# Patient Record
Sex: Male | Born: 1935 | Race: White | Hispanic: No | Marital: Single | Smoking: Current every day smoker
Health system: Southern US, Community
[De-identification: ages and names within clinical notes are randomized; demographics above are authoritative.]

## PROBLEM LIST (undated history)

## (undated) DIAGNOSIS — J449 Chronic obstructive pulmonary disease, unspecified: Secondary | ICD-10-CM

## (undated) DIAGNOSIS — I10 Essential (primary) hypertension: Secondary | ICD-10-CM

## (undated) DIAGNOSIS — N189 Chronic kidney disease, unspecified: Secondary | ICD-10-CM

## (undated) DIAGNOSIS — K219 Gastro-esophageal reflux disease without esophagitis: Secondary | ICD-10-CM

## (undated) DIAGNOSIS — G709 Myoneural disorder, unspecified: Secondary | ICD-10-CM

## (undated) HISTORY — PX: NECK SURGERY: SHX720

---

## 2006-01-15 ENCOUNTER — Ambulatory Visit: Payer: Self-pay | Admitting: Internal Medicine

## 2008-09-07 ENCOUNTER — Ambulatory Visit: Payer: Self-pay | Admitting: Internal Medicine

## 2008-09-08 ENCOUNTER — Ambulatory Visit: Payer: Self-pay | Admitting: Internal Medicine

## 2009-04-27 ENCOUNTER — Ambulatory Visit: Payer: Self-pay | Admitting: Gastroenterology

## 2013-09-24 ENCOUNTER — Ambulatory Visit: Payer: Self-pay | Admitting: Orthopedic Surgery

## 2016-03-15 DIAGNOSIS — R972 Elevated prostate specific antigen [PSA]: Secondary | ICD-10-CM | POA: Insufficient documentation

## 2016-03-15 DIAGNOSIS — J432 Centrilobular emphysema: Secondary | ICD-10-CM | POA: Insufficient documentation

## 2016-03-16 ENCOUNTER — Other Ambulatory Visit: Payer: Self-pay | Admitting: Internal Medicine

## 2016-03-16 DIAGNOSIS — J984 Other disorders of lung: Secondary | ICD-10-CM

## 2016-03-30 ENCOUNTER — Ambulatory Visit
Admission: RE | Admit: 2016-03-30 | Discharge: 2016-03-30 | Disposition: A | Discharge status: Home / Self Care | Payer: Medicare Other | Source: Ambulatory Visit | Attending: Internal Medicine | Admitting: Internal Medicine

## 2016-03-30 DIAGNOSIS — J984 Other disorders of lung: Secondary | ICD-10-CM

## 2016-03-30 DIAGNOSIS — J439 Emphysema, unspecified: Secondary | ICD-10-CM | POA: Diagnosis not present

## 2016-03-30 DIAGNOSIS — E041 Nontoxic single thyroid nodule: Secondary | ICD-10-CM | POA: Diagnosis not present

## 2016-03-30 DIAGNOSIS — R918 Other nonspecific abnormal finding of lung field: Secondary | ICD-10-CM | POA: Diagnosis not present

## 2016-03-30 LAB — POCT I-STAT CREATININE: Creatinine, Ser: 1.7 mg/dL — ABNORMAL HIGH (ref 0.61–1.24)

## 2016-03-30 MED ORDER — IOPAMIDOL (ISOVUE-300) INJECTION 61%
75.0000 mL | Freq: Once | INTRAVENOUS | Status: AC | PRN
  Administered 2016-03-30: 75 mL via INTRAVENOUS

## 2016-04-07 ENCOUNTER — Telehealth: Payer: Self-pay | Admitting: *Deleted

## 2016-04-07 ENCOUNTER — Inpatient Hospital Stay: Payer: Medicare Other

## 2016-04-07 ENCOUNTER — Encounter: Payer: Self-pay | Admitting: Oncology

## 2016-04-07 ENCOUNTER — Inpatient Hospital Stay: Payer: Medicare Other | Attending: Oncology | Admitting: Oncology

## 2016-04-07 VITALS — BP 184/94 | HR 89 | Temp 97.1°F | Resp 20 | Ht 70.0 in | Wt 163.0 lb

## 2016-04-07 DIAGNOSIS — I10 Essential (primary) hypertension: Secondary | ICD-10-CM | POA: Insufficient documentation

## 2016-04-07 DIAGNOSIS — R918 Other nonspecific abnormal finding of lung field: Secondary | ICD-10-CM | POA: Insufficient documentation

## 2016-04-07 DIAGNOSIS — N183 Chronic kidney disease, stage 3 unspecified: Secondary | ICD-10-CM | POA: Insufficient documentation

## 2016-04-07 DIAGNOSIS — Z808 Family history of malignant neoplasm of other organs or systems: Secondary | ICD-10-CM | POA: Insufficient documentation

## 2016-04-07 DIAGNOSIS — R970 Elevated carcinoembryonic antigen [CEA]: Secondary | ICD-10-CM | POA: Diagnosis not present

## 2016-04-07 DIAGNOSIS — Z87891 Personal history of nicotine dependence: Secondary | ICD-10-CM | POA: Diagnosis not present

## 2016-04-07 LAB — CBC WITH DIFFERENTIAL/PLATELET
BASOS ABS: 0.1 10*3/uL (ref 0–0.1)
BASOS PCT: 1 %
Eosinophils Absolute: 0.3 10*3/uL (ref 0–0.7)
Eosinophils Relative: 4 %
HEMATOCRIT: 42.9 % (ref 40.0–52.0)
HEMOGLOBIN: 14.3 g/dL (ref 13.0–18.0)
LYMPHS PCT: 18 %
Lymphs Abs: 1.4 10*3/uL (ref 1.0–3.6)
MCH: 31 pg (ref 26.0–34.0)
MCHC: 33.4 g/dL (ref 32.0–36.0)
MCV: 92.9 fL (ref 80.0–100.0)
MONO ABS: 0.7 10*3/uL (ref 0.2–1.0)
MONOS PCT: 8 %
NEUTROS PCT: 69 %
Neutro Abs: 5.6 10*3/uL (ref 1.4–6.5)
Platelets: 195 10*3/uL (ref 150–440)
RBC: 4.61 MIL/uL (ref 4.40–5.90)
RDW: 15.4 % — AB (ref 11.5–14.5)
WBC: 8 10*3/uL (ref 3.8–10.6)

## 2016-04-07 LAB — COMPREHENSIVE METABOLIC PANEL
ALT: 9 U/L — AB (ref 17–63)
AST: 17 U/L (ref 15–41)
Albumin: 4.2 g/dL (ref 3.5–5.0)
Alkaline Phosphatase: 54 U/L (ref 38–126)
Anion gap: 10 (ref 5–15)
BUN: 20 mg/dL (ref 6–20)
CHLORIDE: 103 mmol/L (ref 101–111)
CO2: 28 mmol/L (ref 22–32)
CREATININE: 1.94 mg/dL — AB (ref 0.61–1.24)
Calcium: 8.7 mg/dL — ABNORMAL LOW (ref 8.9–10.3)
GFR calc Af Amer: 36 mL/min — ABNORMAL LOW (ref >60)
GFR calc non Af Amer: 31 mL/min — ABNORMAL LOW (ref >60)
Glucose, Bld: 80 mg/dL (ref 65–99)
POTASSIUM: 4.3 mmol/L (ref 3.5–5.1)
SODIUM: 141 mmol/L (ref 135–145)
Total Bilirubin: 0.6 mg/dL (ref 0.3–1.2)
Total Protein: 7.6 g/dL (ref 6.5–8.1)

## 2016-04-07 LAB — APTT: APTT: 30 s (ref 24–36)

## 2016-04-07 LAB — PROTIME-INR
INR: 0.9
PROTHROMBIN TIME: 12.4 s (ref 11.4–15.0)

## 2016-04-07 LAB — PSA: PSA: 6.5 ng/mL — AB (ref 0.00–4.00)

## 2016-04-07 NOTE — Telephone Encounter (Signed)
Spoke with Misty at Dr. Clovis FredricksonKasa's office, patient scheduled for consultation visit with Dr. Belia HemanKasa on Thursday 6/15 @ 10:30. Patient notified of appointment and verbalized understanding.

## 2016-04-07 NOTE — Progress Notes (Signed)
Patient here today for new evaluation regarding lung mass. Patient denies concerns today.

## 2016-04-08 LAB — CANCER ANTIGEN 19-9: CA 19 9: 28 U/mL (ref 0–35)

## 2016-04-08 LAB — CEA: CEA: 15.6 ng/mL — AB (ref 0.0–4.7)

## 2016-04-10 NOTE — Progress Notes (Signed)
[facility]  [phone] [phone]  ID: Shanda Bumps OB: 11/19/1931  MR#: [id number]  AOZ#:[id number]  Patient Care Team: Barbette Reichmann, MD as PCP - General (Internal Medicine)  CHIEF COMPLAINT:  Chief Complaint  Patient presents with  . New Evaluation    Lung mass    INTERVAL HISTORY: Patient is an 82 year old male with an extensive tobacco history as noted to have a concerning lesion on routine chest x-ray. Subsequent CT scan revealed bilateral pulmonary nodules suspicious for underlying malignancy. Currently, he feels well and is asymptomatic. He has no neurologic complaints. He denies any recent fevers or illnesses. He has a good appetite and denies weight loss. He denies any chest pain, shortness of breath, or hemoptysis. He denies any nausea, vomiting, constipation, or diarrhea. He has no melena or hematochezia. He has no urinary complaints. Patient otherwise feels well and offers no further specific complaints.  REVIEW OF SYSTEMS:   Review of Systems  Constitutional: Negative.  Negative for fever, weight loss and malaise/fatigue.  Respiratory: Negative.  Negative for cough, hemoptysis and shortness of breath.   Cardiovascular: Negative.  Negative for chest pain.  Gastrointestinal: Negative.  Negative for abdominal pain, blood in stool and melena.  Genitourinary: Negative.   Musculoskeletal: Negative.   Neurological: Negative.  Negative for weakness.  Psychiatric/Behavioral: Negative.     As per HPI. Otherwise, a complete review of systems is negatve.  PAST MEDICAL HISTORY: No past medical history on file.  PAST SURGICAL HISTORY: Past Surgical History  Procedure Laterality Date  . Neck surgery      FAMILY HISTORY Family History  Problem Relation Age of Onset  . Cancer Mother     Brain/Eye  . Cancer Father     Liver cancer       ADVANCED DIRECTIVES:    HEALTH MAINTENANCE: Social History  Substance Use Topics    . Smoking status: Not on file  . Smokeless tobacco: Not on file  . Alcohol Use: Not on file     Colonoscopy:  PAP:  Bone density:  Lipid panel:  No Known Allergies  No current outpatient prescriptions on file.   No current facility-administered medications for this visit.    OBJECTIVE: Filed Vitals:   04/07/16 1511  BP: 184/94  Pulse: 89  Temp: 97.1 F (36.2 C)  Resp: 20     Body mass index is 23.39 kg/(m^2).    ECOG FS:0 - Asymptomatic  General: Well-developed, well-nourished, no acute distress. Eyes: Pink conjunctiva, anicteric sclera. HEENT: Normocephalic, moist mucous membranes, clear oropharnyx. Lungs: Clear to auscultation bilaterally. Heart: Regular rate and rhythm. No rubs, murmurs, or gallops. Abdomen: Soft, nontender, nondistended. No organomegaly noted, normoactive bowel sounds. Musculoskeletal: No edema, cyanosis, or clubbing. Neuro: Alert, answering all questions appropriately. Cranial nerves grossly intact. Skin: No rashes or petechiae noted. Psych: Normal affect. Lymphatics: No cervical, calvicular, axillary or inguinal LAD.   LAB RESULTS:  Lab Results  Component Value Date   NA 141 04/07/2016   K 4.3 04/07/2016   CL 103 04/07/2016   CO2 28 04/07/2016   GLUCOSE 80 04/07/2016   BUN 20 04/07/2016   CREATININE 1.94* 04/07/2016   CALCIUM 8.7* 04/07/2016   PROT 7.6 04/07/2016   ALBUMIN 4.2 04/07/2016   AST 17 04/07/2016   ALT 9* 04/07/2016   ALKPHOS 54 04/07/2016   BILITOT 0.6 04/07/2016   GFRNONAA 31* 04/07/2016   GFRAA 36* 04/07/2016    Lab Results  Component Value Date   WBC  8.0 04/07/2016   NEUTROABS 5.6 04/07/2016   HGB 14.3 04/07/2016   HCT 42.9 04/07/2016   MCV 92.9 04/07/2016   PLT 195 04/07/2016   Lab Results  Component Value Date   CEA 15.6* 04/07/2016   Lab Results  Component Value Date   PSA 6.50* 04/07/2016   Lab Results  Component Value Date   CA199 28 04/07/2016     STUDIES: Ct Chest W  Contrast  03/30/2016  CLINICAL DATA:  Chronic cough for 1 year. Left upper lobe mass on recent chest radiograph. EXAM: CT CHEST WITH CONTRAST TECHNIQUE: Multidetector CT imaging of the chest was performed during intravenous contrast administration. CONTRAST:  75mL ISOVUE-300 IOPAMIDOL (ISOVUE-300) INJECTION 61% COMPARISON:  None. FINDINGS: Mediastinum/Lymph Nodes: No lymphadenopathy identified within mediastinum, hilar regions or elsewhere within the thorax. Calcified right hilar and subcarinal mediastinal lymph nodes are seen, consistent with old granulomatous disease. A left thyroid lobe nodule is seen measuring 1.7 cm. Heart size is normal. The three-vessel coronary artery calcification noted. Aortic atherosclerotic calcification noted. Lungs/Pleura: Moderate to severe emphysema is demonstrated. Spiculated mass is seen in the anterior left upper lobe measuring 3.4 x 4.5 cm on image 39/series 3. A spiculated nodule is seen in the central right upper lobe measuring 1.3 x 1.4 cm on image 40/series 3. Both of these are highly suspicious for primary bronchogenic carcinomas. A large calcified granuloma is noted in the medial right lower lobe. No evidence of pleural effusion. Upper abdomen: No acute findings. Normal appearance both adrenal glands and visualized portion of liver. Probable tiny right renal cysts noted. Musculoskeletal: No chest wall mass or suspicious bone lesions identified. IMPRESSION: 4.5 cm spiculated mass in left upper lobe and 1.4 cm spiculated nodule in right upper lobe, highly suspicious for synchronous primary bronchogenic carcinomas. No evidence of lymphadenopathy or other metastatic disease within the thorax. Moderate to severe emphysema. 1.7 cm left thyroid lobe nodule. Thyroid ultrasound could be performed for further evaluation although this may not be necessary in setting of limited life expectancy and comorbidities. This follows ACR consensus guidelines: Managing Incidental Thyroid Nodules  Detected on Imaging: White Paper of the ACR Incidental Thyroid Findings Committee. J Am Coll Radiol 2015; 12:143-150. Electronically Signed   By: Myles RosenthalJohn  Stahl M.D.   On: 03/30/2016 15:16    ASSESSMENT:  Bilateral pulmonary nodules highly suspicious for underlying malignancy.  PLAN:    1. Bilateral pulmonary nodules: Given patient's extensive tobacco history, these are highly suspicious for underlying malignancy. Unclear if there are metastatic lesions or possibly synchronous primaries. Case discussed with Dr. Belia HemanKasa and pulmonology has agreed to perform navigational bronchoscopy on both lesions in approximately one week. Patient will ultimately require a PET scan and an MRI of his brain to complete staging workup. Tumor markers from today revealed an elevated CEA as well as an elevated PSA. Patient will follow-up in clinic several days after his biopsy to discuss the results and treatment planning if desired.  Approximately 45 minutes was spent in discussion of which greater than 50% was consultation.  Patient expressed understanding and was in agreement with this plan. He also understands that He can call clinic at any time with any questions, concerns, or complaints.    Jeralyn Ruthsimothy J Easten Maceachern, MD   04/10/2016 7:54 AM

## 2016-04-13 ENCOUNTER — Encounter: Payer: Self-pay | Admitting: Internal Medicine

## 2016-04-13 ENCOUNTER — Ambulatory Visit (INDEPENDENT_AMBULATORY_CARE_PROVIDER_SITE_OTHER): Payer: Medicare Other | Admitting: Internal Medicine

## 2016-04-13 VITALS — BP 166/88 | HR 91 | Wt 165.0 lb

## 2016-04-13 DIAGNOSIS — J439 Emphysema, unspecified: Secondary | ICD-10-CM | POA: Diagnosis not present

## 2016-04-13 DIAGNOSIS — R918 Other nonspecific abnormal finding of lung field: Secondary | ICD-10-CM | POA: Diagnosis not present

## 2016-04-13 MED ORDER — FLUTICASONE FUROATE-VILANTEROL 200-25 MCG/INH IN AEPB: 1.0000 | INHALATION_SPRAY | Freq: Every day | RESPIRATORY_TRACT | Status: DC

## 2016-04-13 MED ORDER — ALBUTEROL SULFATE HFA 108 (90 BASE) MCG/ACT IN AERS: 2.0000 | INHALATION_SPRAY | RESPIRATORY_TRACT | Status: AC | PRN

## 2016-04-13 NOTE — Progress Notes (Signed)
[facility] Pulmonary Medicine Consultation      Date: 04/13/2016,   MRN# [id number] Tommy Jordan September 09, 1932 Code Status:  Code Status History    This patient does not have a recorded code status. Please follow your organizational policy for patients in this situation.     Hosp day:@LENGTHOFSTAYDAYS @ Referring MD: @ATDPROV @     PCP:      AdmissionWeight: 165 lb (74.844 kg)                 CurrentWeight: 165 lb (74.844 kg) Tommy Jordan is a 82 y.o. old male seen in consultation for lung mass at the request of Dr. Fidela Juneau.     CHIEF COMPLAINT:   abnormal CT chest   HISTORY OF PRESENT ILLNESS   82 yo white male seen today for abnormal CT chest. Patient primary care physician obtained routine CXR which led to CT chest There is a LUL mass and RUL nodule, also evidence of emphysema b/l lungs  Patient is long time and current smoker 2 ppd for 65 years Patient has chronic SOB and DOE for many years, he has no wheezing and no chest pain Patient has chronic intermittent cough with sputum production for many years  Has no signs of infection at this time, no lower ext swelling      PAST MEDICAL HISTORY   History reviewed. No pertinent past medical history.   SURGICAL HISTORY   Past Surgical History  Procedure Laterality Date  . Neck surgery       FAMILY HISTORY   Family History  Problem Relation Age of Onset  . Cancer Mother     Brain/Eye  . Cancer Father     Liver cancer     SOCIAL HISTORY   Social History  Substance Use Topics  . Smoking status: Current Some Day Smoker -- 2.00 packs/day  . Smokeless tobacco: None  . Alcohol Use: None     MEDICATIONS    Home Medication:  Current Outpatient Rx  Name  Route  Sig  Dispense  Refill  . amLODipine (NORVASC) 2.5 MG tablet   Oral   Take 2.5 mg by mouth daily.         Marland Kitchen gabapentin (NEURONTIN) 300 MG capsule   Oral   Take 300 mg by mouth 2 (two) times daily.         Marland Kitchen  HYDROcodone-acetaminophen (NORCO/VICODIN) 5-325 MG tablet   Oral   Take 1 tablet by mouth 2 (two) times daily.         Marland Kitchen lisinopril (PRINIVIL,ZESTRIL) 40 MG tablet   Oral   Take 40 mg by mouth daily.         Marland Kitchen albuterol (PROVENTIL HFA;VENTOLIN HFA) 108 (90 Base) MCG/ACT inhaler   Inhalation   Inhale 2 puffs into the lungs every 4 (four) hours as needed for wheezing or shortness of breath.   1 Inhaler   8     Current Medication:  Current outpatient prescriptions:  .  amLODipine (NORVASC) 2.5 MG tablet, Take 2.5 mg by mouth daily., Disp: , Rfl:  .  gabapentin (NEURONTIN) 300 MG capsule, Take 300 mg by mouth 2 (two) times daily., Disp: , Rfl:  .  HYDROcodone-acetaminophen (NORCO/VICODIN) 5-325 MG tablet, Take 1 tablet by mouth 2 (two) times daily., Disp: , Rfl:  .  lisinopril (PRINIVIL,ZESTRIL) 40 MG tablet, Take 40 mg by mouth daily., Disp: , Rfl:  .  albuterol (PROVENTIL HFA;VENTOLIN HFA) 108 (90 Base) MCG/ACT inhaler, Inhale 2 puffs  into the lungs every 4 (four) hours as needed for wheezing or shortness of breath., Disp: 1 Inhaler, Rfl: 8    ALLERGIES   Review of patient's allergies indicates no known allergies.     REVIEW OF SYSTEMS   Review of Systems  Constitutional: Negative for fever, chills, weight loss, malaise/fatigue and diaphoresis.  HENT: Negative for congestion and hearing loss.   Eyes: Negative for blurred vision and double vision.  Respiratory: Positive for cough, sputum production and shortness of breath. Negative for wheezing.   Cardiovascular: Negative for chest pain, palpitations and orthopnea.  Gastrointestinal: Negative for heartburn, nausea, vomiting, abdominal pain, diarrhea, constipation and blood in stool.  Genitourinary: Negative for dysuria and urgency.  Musculoskeletal: Negative for myalgias, back pain and neck pain.  Skin: Negative for rash.  Neurological: Negative for dizziness, tingling, tremors, weakness and headaches.    Endo/Heme/Allergies: Does not bruise/bleed easily.  Psychiatric/Behavioral: Negative for depression, suicidal ideas and substance abuse.  All other systems reviewed and are negative.    VS: BP 166/88 mmHg  Pulse 91  Wt 165 lb (74.844 kg)  SpO2 93%     PHYSICAL EXAM  Physical Exam  Constitutional: He is oriented to person, place, and time. He appears well-developed and well-nourished. No distress.  HENT:  Head: Normocephalic and atraumatic.  Mouth/Throat: No oropharyngeal exudate.  Eyes: EOM are normal. Pupils are equal, round, and reactive to light. No scleral icterus.  Neck: Normal range of motion. Neck supple.  Cardiovascular: Normal rate, regular rhythm and normal heart sounds.   No murmur heard. Pulmonary/Chest: No stridor. No respiratory distress. He has no rales.  barrel chest  Abdominal: Soft. Bowel sounds are normal. He exhibits no distension. There is no tenderness. There is no rebound.  Musculoskeletal: Normal range of motion. He exhibits no edema.  Neurological: He is alert and oriented to person, place, and time. No cranial nerve deficit.  Skin: Skin is warm. He is not diaphoretic.  Psychiatric: He has a normal mood and affect.           IMAGING    Ct Chest W Contrast  03/30/2016  CLINICAL DATA:  Chronic cough for 1 year. Left upper lobe mass on recent chest radiograph. EXAM: CT CHEST WITH CONTRAST TECHNIQUE: Multidetector CT imaging of the chest was performed during intravenous contrast administration. CONTRAST:  75mL ISOVUE-300 IOPAMIDOL (ISOVUE-300) INJECTION 61% COMPARISON:  None. FINDINGS: Mediastinum/Lymph Nodes: No lymphadenopathy identified within mediastinum, hilar regions or elsewhere within the thorax. Calcified right hilar and subcarinal mediastinal lymph nodes are seen, consistent with old granulomatous disease. A left thyroid lobe nodule is seen measuring 1.7 cm. Heart size is normal. The three-vessel coronary artery calcification noted. Aortic  atherosclerotic calcification noted. Lungs/Pleura: Moderate to severe emphysema is demonstrated. Spiculated mass is seen in the anterior left upper lobe measuring 3.4 x 4.5 cm on image 39/series 3. A spiculated nodule is seen in the central right upper lobe measuring 1.3 x 1.4 cm on image 40/series 3. Both of these are highly suspicious for primary bronchogenic carcinomas. A large calcified granuloma is noted in the medial right lower lobe. No evidence of pleural effusion. Upper abdomen: No acute findings. Normal appearance both adrenal glands and visualized portion of liver. Probable tiny right renal cysts noted. Musculoskeletal: No chest wall mass or suspicious bone lesions identified. IMPRESSION: 4.5 cm spiculated mass in left upper lobe and 1.4 cm spiculated nodule in right upper lobe, highly suspicious for synchronous primary bronchogenic carcinomas. No evidence of lymphadenopathy or other  metastatic disease within the thorax. Moderate to severe emphysema. 1.7 cm left thyroid lobe nodule. Thyroid ultrasound could be performed for further evaluation although this may not be necessary in setting of limited life expectancy and comorbidities. This follows ACR consensus guidelines: Managing Incidental Thyroid Nodules Detected on Imaging: White Paper of the ACR Incidental Thyroid Findings Committee. J Am Coll Radiol 2015; 12:143-150. Electronically Signed   By: Myles RosenthalJohn  Jordan M.D.   On: 03/30/2016 15:16  CT images reviewed with patient/family 04/13/2016    Images revi  ASSESSMENT/PLAN   82 yo white male seen today for several issues. 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   The Risks and Benefits of the ENB were explained to patient/family and I have discussed the risk for  acute bleeding, increased chance of infection, increased chance of respiratory failure and cardiac arrest and death. I have also explained to avoid all types of NSAIDs to decrease chance of bleeding, and to avoid food and drinks the midnight prior to procedure.  The patient/family understand the risks and benefits and have agreed to proceed with procedure. I have explained that we should try to maximize lung function prior to ENB procedure. Patient agrees to take inhaled meds however, he REFUSES to STOP smoking  1.obtain PFT with 6MWT 2.start BREO 3.albuterol as needed 4.check ONO 5.will set up ENB in 3-4 weeks  Will follow up in 3 months    I have personally obtained a history, examined the patient, evaluated laboratory and independently reviewed imaging results, formulated the assessment and plan and placed orders.  The Patient requires high complexity decision making for assessment and support, frequent evaluation and titration of therapies, application of advanced monitoring technologies and extensive interpretation of multiple databases.   Patient/Family are satisfied with Plan of action and management. All questions answered  Lucie LeatherKurian David Zakariah Dejarnette, M.D.  [facility]  Medical Director [facility] Medical Director [facility]

## 2016-04-13 NOTE — Addendum Note (Signed)
Addended by: Meyer CoryAHMAD, America Sandall R on: 04/13/2016 11:50 AM   Modules accepted: Orders

## 2016-04-13 NOTE — Patient Instructions (Signed)
PFT and 6 MWT Check ONO Start Breo Albuterol as needed every 4 hrs Set for ENB procedure in 3-4 weeks    Chronic Obstructive Pulmonary Disease Chronic obstructive pulmonary disease (COPD) is a common lung condition in which airflow from the lungs is limited. COPD is a general term that can be used to describe many different lung problems that limit airflow, including both chronic bronchitis and emphysema. If you have COPD, your lung function will probably never return to normal, but there are measures you can take to improve lung function and make yourself feel better. CAUSES   Smoking (common).  Exposure to secondhand smoke.  Genetic problems.  Chronic inflammatory lung diseases or recurrent infections. SYMPTOMS  Shortness of breath, especially with physical activity.  Deep, persistent (chronic) cough with a large amount of thick mucus.  Wheezing.  Rapid breaths (tachypnea).  Gray or bluish discoloration (cyanosis) of the skin, especially in your fingers, toes, or lips.  Fatigue.  Weight loss.  Frequent infections or episodes when breathing symptoms become much worse (exacerbations).  Chest tightness. DIAGNOSIS Your health care provider will take a medical history and perform a physical examination to diagnose COPD. Additional tests for COPD may include:  Lung (pulmonary) function tests.  Chest X-ray.  CT scan.  Blood tests. TREATMENT  Treatment for COPD may include:  Inhaler and nebulizer medicines. These help manage the symptoms of COPD and make your breathing more comfortable.  Supplemental oxygen. Supplemental oxygen is only helpful if you have a low oxygen level in your blood.  Exercise and physical activity. These are beneficial for nearly all people with COPD.  Lung surgery or transplant.  Nutrition therapy to gain weight, if you are underweight.  Pulmonary rehabilitation. This may involve working with a team of health care providers and  specialists, such as respiratory, occupational, and physical therapists. HOME CARE INSTRUCTIONS  Take all medicines (inhaled or pills) as directed by your health care provider.  Avoid over-the-counter medicines or cough syrups that dry up your airway (such as antihistamines) and slow down the elimination of secretions unless instructed otherwise by your health care provider.  If you are a smoker, the most important thing that you can do is stop smoking. Continuing to smoke will cause further lung damage and breathing trouble. Ask your health care provider for help with quitting smoking. He or she can direct you to community resources or hospitals that provide support.  Avoid exposure to irritants such as smoke, chemicals, and fumes that aggravate your breathing.  Use oxygen therapy and pulmonary rehabilitation if directed by your health care provider. If you require home oxygen therapy, ask your health care provider whether you should purchase a pulse oximeter to measure your oxygen level at home.  Avoid contact with individuals who have a contagious illness.  Avoid extreme temperature and humidity changes.  Eat healthy foods. Eating smaller, more frequent meals and resting before meals may help you maintain your strength.  Stay active, but balance activity with periods of rest. Exercise and physical activity will help you maintain your ability to do things you want to do.  Preventing infection and hospitalization is very important when you have COPD. Make sure to receive all the vaccines your health care provider recommends, especially the pneumococcal and influenza vaccines. Ask your health care provider whether you need a pneumonia vaccine.  Learn and use relaxation techniques to manage stress.  Learn and use controlled breathing techniques as directed by your health care provider. Controlled breathing techniques  include:  Pursed lip breathing. Start by breathing in (inhaling) through  your nose for 1 second. Then, purse your lips as if you were going to whistle and breathe out (exhale) through the pursed lips for 2 seconds.  Diaphragmatic breathing. Start by putting one hand on your abdomen just above your waist. Inhale slowly through your nose. The hand on your abdomen should move out. Then purse your lips and exhale slowly. You should be able to feel the hand on your abdomen moving in as you exhale.  Learn and use controlled coughing to clear mucus from your lungs. Controlled coughing is a series of short, progressive coughs. The steps of controlled coughing are: 1. Lean your head slightly forward. 2. Breathe in deeply using diaphragmatic breathing. 3. Try to hold your breath for 3 seconds. 4. Keep your mouth slightly open while coughing twice. 5. Spit any mucus out into a tissue. 6. Rest and repeat the steps once or twice as needed. SEEK MEDICAL CARE IF:  You are coughing up more mucus than usual.  There is a change in the color or thickness of your mucus.  Your breathing is more labored than usual.  Your breathing is faster than usual. SEEK IMMEDIATE MEDICAL CARE IF:  You have shortness of breath while you are resting.  You have shortness of breath that prevents you from:  Being able to talk.  Performing your usual physical activities.  You have chest pain lasting longer than 5 minutes.  Your skin color is more cyanotic than usual.  You measure low oxygen saturations for longer than 5 minutes with a pulse oximeter. MAKE SURE YOU:  Understand these instructions.  Will watch your condition.  Will get help right away if you are not doing well or get worse.   This information is not intended to replace advice given to you by your health care provider. Make sure you discuss any questions you have with your health care provider.   Document Released: 07/26/2005 Document Revised: 11/06/2014 Document Reviewed: 06/12/2013 Elsevier Interactive Patient  Education [employer].

## 2016-04-13 NOTE — Progress Notes (Signed)
Patient ID: Tommy Jordan, male   DOB: 01/05/1932, 82 y.o.   MRN: [id number] Patient seen in the office today and instructed on use of Breo.  Patient expressed understanding and demonstrated technique.

## 2016-04-14 ENCOUNTER — Encounter
Admission: RE | Admit: 2016-04-14 | Discharge: 2016-04-14 | Disposition: A | Discharge status: Home / Self Care | Payer: Medicare Other | Source: Ambulatory Visit | Attending: Oncology | Admitting: Oncology

## 2016-04-14 DIAGNOSIS — R918 Other nonspecific abnormal finding of lung field: Secondary | ICD-10-CM | POA: Insufficient documentation

## 2016-04-14 LAB — GLUCOSE, CAPILLARY: Glucose-Capillary: 89 mg/dL (ref 65–99)

## 2016-04-14 MED ORDER — FLUDEOXYGLUCOSE F - 18 (FDG) INJECTION
12.0000 | Freq: Once | INTRAVENOUS | Status: AC | PRN
  Administered 2016-04-14: 11.89 via INTRAVENOUS

## 2016-04-17 ENCOUNTER — Telehealth: Payer: Self-pay | Admitting: Internal Medicine

## 2016-04-17 NOTE — Telephone Encounter (Signed)
States pt's inhaler is not working on the sample. Will give another sample. Daughter informed of pre assesment  appt and bronch procedure date. Nothing further needed.

## 2016-04-17 NOTE — Telephone Encounter (Signed)
Pt daughter is calling, states she thinks pt inhaler is not working right. Please call, if unable to reach by daughter #, call pt at [phone]

## 2016-04-20 ENCOUNTER — Telehealth: Payer: Self-pay | Admitting: Internal Medicine

## 2016-04-20 NOTE — Telephone Encounter (Signed)
Please call patient's daughter Angie at # [phone]. She is calling about PET scan results

## 2016-04-20 NOTE — Telephone Encounter (Signed)
LMOM that they should call Finnegan's office for pet scan results and to call me back with any further questions.

## 2016-04-25 ENCOUNTER — Telehealth: Payer: Self-pay | Admitting: *Deleted

## 2016-04-25 ENCOUNTER — Encounter: Payer: Self-pay | Admitting: Internal Medicine

## 2016-04-25 NOTE — Telephone Encounter (Signed)
Asking for results of PET. No fu until 7/19

## 2016-04-25 NOTE — Telephone Encounter (Signed)
Patient notified of results.

## 2016-05-01 ENCOUNTER — Telehealth: Payer: Self-pay | Admitting: Internal Medicine

## 2016-05-01 NOTE — Telephone Encounter (Signed)
Results placed in DK's folder.

## 2016-05-01 NOTE — Telephone Encounter (Signed)
Please call regarding Dr. Belia HemanKasa ordering night time oxygen for this pt.

## 2016-05-03 ENCOUNTER — Telehealth: Payer: Self-pay | Admitting: *Deleted

## 2016-05-03 ENCOUNTER — Encounter
Admission: RE | Admit: 2016-05-03 | Discharge: 2016-05-03 | Disposition: A | Discharge status: Home / Self Care | Payer: Medicare Other | Source: Ambulatory Visit | Attending: Internal Medicine | Admitting: Internal Medicine

## 2016-05-03 DIAGNOSIS — Z01810 Encounter for preprocedural cardiovascular examination: Secondary | ICD-10-CM | POA: Diagnosis present

## 2016-05-03 DIAGNOSIS — I10 Essential (primary) hypertension: Secondary | ICD-10-CM | POA: Diagnosis not present

## 2016-05-03 HISTORY — DX: Chronic obstructive pulmonary disease, unspecified: J44.9

## 2016-05-03 HISTORY — DX: Myoneural disorder, unspecified: G70.9

## 2016-05-03 HISTORY — DX: Chronic kidney disease, unspecified: N18.9

## 2016-05-03 HISTORY — DX: Gastro-esophageal reflux disease without esophagitis: K21.9

## 2016-05-03 HISTORY — DX: Essential (primary) hypertension: I10

## 2016-05-03 NOTE — Pre-Procedure Instructions (Addendum)
Patient with abnormal EKG at PAT visit, no old EKG to compare. Clearance request sent to patient primary physician Dr Marcello FennelHande.

## 2016-05-03 NOTE — Pre-Procedure Instructions (Signed)
Misty at [place] office notified of clearance request

## 2016-05-03 NOTE — Patient Instructions (Signed)
Your procedure is scheduled on: Thursday 05/11/16 Report to Day Surgery. 2ND FLOOR MEDICAL MALL ENTRANCE To find out your arrival time please call [phone] between 1PM - 3PM on Wednesday 05/10/16.  Remember: Instructions that are not followed completely may result in serious medical risk, up to and including death, or upon the discretion of your surgeon and anesthesiologist your surgery may need to be rescheduled.    __X__ 1. Do not eat food or drink liquids after midnight. No gum chewing or hard candies.     __X__ 2. No Alcohol for 24 hours before or after surgery.   ____ 3. Bring all medications with you on the day of surgery if instructed.    __X__ 4. Notify your doctor if there is any change in your medical condition     (cold, fever, infections).     Do not wear jewelry, make-up, hairpins, clips or nail polish.  Do not wear lotions, powders, or perfumes.   Do not shave 48 hours prior to surgery. Men may shave face and neck.  Do not bring valuables to the hospital.    [facility] any belongings or valuables.               Contacts, dentures or bridgework may not be worn into surgery.  Leave your suitcase in the car. After surgery it may be brought to your room.  For patients admitted to the hospital, discharge time is determined by your                treatment team.   Patients discharged the day of surgery will not be allowed to drive home.   Please read over the following fact sheets that you were given:   Surgical Site Infection Prevention   __X__ Take these medicines the morning of surgery with A SIP OF WATER:    1. AMLODIPINE  2. LISINOPRIL  3.   4.  5.  6.  ____ Fleet Enema (as directed)   ____ Use CHG Soap as directed  __X__ Use inhalers on the day of surgery  ____ Stop metformin 2 days prior to surgery    ____ Take 1/2 of usual insulin dose the night before surgery and none on the morning of surgery.   ____ Stop  Coumadin/Plavix/aspirin on   ____ Stop Anti-inflammatories on    ____ Stop supplements until after surgery.    ____ Bring C-Pap to the hospital.

## 2016-05-03 NOTE — Telephone Encounter (Signed)
[facility] stating that pt had an abnormal EKG and that she faxed it to pt's PCP office for clearance. I have called pt's daughter to see if pt has a cardiologist he sees or if we need to set him up for a cardiology clearance appt. Will wait for daughter Marylene Landngela to call back.

## 2016-05-04 NOTE — Telephone Encounter (Signed)
Made cardiology appt for pt. Informed pt and daughter Tommy Jordan. appt w/Dr. Kirke CorinArida 05/05/16 @ 11am. Nothing further needed.

## 2016-05-04 NOTE — Telephone Encounter (Signed)
Tried to call [place] Angela [place]. Spoke with pt and will schedule cardiology appt for clearance. Will call pt back with appt.

## 2016-05-05 ENCOUNTER — Ambulatory Visit (INDEPENDENT_AMBULATORY_CARE_PROVIDER_SITE_OTHER): Payer: Medicare Other | Admitting: Cardiovascular Disease

## 2016-05-05 ENCOUNTER — Encounter: Payer: Self-pay | Admitting: Cardiovascular Disease

## 2016-05-05 ENCOUNTER — Encounter (INDEPENDENT_AMBULATORY_CARE_PROVIDER_SITE_OTHER): Payer: Self-pay

## 2016-05-05 VITALS — BP 160/90 | HR 77 | Ht 71.0 in | Wt 166.0 lb

## 2016-05-05 DIAGNOSIS — R0602 Shortness of breath: Secondary | ICD-10-CM

## 2016-05-05 NOTE — Pre-Procedure Instructions (Signed)
Medical clearance on chart. 

## 2016-05-05 NOTE — Progress Notes (Signed)
Cardiology Office Note   Date:  05/05/2016   ID:  Tommy BumpsStanley M Waldorf, DOB 02/04/1932, MRN [id number]  PCP:  Barbette ReichmannHANDE,VISHWANATH, MD  Cardiologist:   Lorine BearsMuhammad Ivin Rosenbloom, MD   Chief Complaint  Patient presents with  . New Evaluation    abnormal ekg      History of Present Illness: Tommy Jordan is a 82 y.o. male who was referred for preoperative cardiovascular evaluation before bronchoscopy and lung biopsy and possible need for surgery. He was found to have an abnormal EKG. He has no previous cardiac history but has known history of hypertension and prolonged history of tobacco use with active smoking of 2 packs per day for the last 65 years. He was recently diagnosed with COPD and lung mass and is suspected of having lung cancer. He denies any chest pain, shortness of breath, orthopnea or PND. No leg edema. He reports being able to perform activities of daily living around the house but he does not stress himself much. He was noted recently to have PVCs on his EKG but he denies any palpitations, syncope or presyncope.     Past Medical History  Diagnosis Date  . Neuromuscular disorder (HCC)     sciatica  . Hypertension   . COPD (chronic obstructive pulmonary disease) (HCC)   . GERD (gastroesophageal reflux disease)   . Chronic kidney disease     RENAL INSUFFICIENCY    Past Surgical History  Procedure Laterality Date  . Neck surgery       Current Outpatient Prescriptions  Medication Sig Dispense Refill  . albuterol (PROVENTIL HFA;VENTOLIN HFA) 108 (90 Base) MCG/ACT inhaler Inhale 2 puffs into the lungs every 4 (four) hours as needed for wheezing or shortness of breath. 1 Inhaler 8  . amLODipine (NORVASC) 2.5 MG tablet Take 2.5 mg by mouth daily.    . fluticasone furoate-vilanterol (BREO ELLIPTA) 200-25 MCG/INH AEPB Inhale 1 puff into the lungs daily. 60 each 5  . gabapentin (NEURONTIN) 300 MG capsule Take 300 mg by mouth 2 (two) times daily.    Marland Kitchen. HYDROcodone-acetaminophen  (NORCO/VICODIN) 5-325 MG tablet Take 1 tablet by mouth 2 (two) times daily.    Marland Kitchen. lisinopril (PRINIVIL,ZESTRIL) 40 MG tablet Take 40 mg by mouth daily.     No current facility-administered medications for this visit.    Allergies:   Review of patient's allergies indicates no known allergies.    Social History:  The patient  reports that he has been smoking.  He has never used smokeless tobacco. He reports that he drinks alcohol. He reports that he does not use illicit drugs.   Family History:  The patient's family history includes Cancer in his father and mother.    ROS:  Please see the history of present illness.   Otherwise, review of systems are positive for none.   All other systems are reviewed and negative.    PHYSICAL EXAM: VS:  BP 160/90 mmHg  Pulse 77  Ht 5\' 11"  (1.803 m)  Wt 166 lb (75.297 kg)  BMI 23.16 kg/m2 , BMI Body mass index is 23.16 kg/(m^2). GEN: Well nourished, well developed, in no acute distress HEENT: normal Neck: no JVD, carotid bruits, or masses Cardiac: RRR; no murmurs, rubs, or gallops,no edema  Respiratory:  clear to auscultation bilaterally, normal work of breathing GI: soft, nontender, nondistended, + BS MS: no deformity or atrophy Skin: warm and dry, no rash Neuro:  Strength and sensation are intact Psych: euthymic mood, full affect  EKG:  EKG is ordered today. The ekg ordered today demonstrates ectopic atrial rhythm with inferior T wave changes suggestive of ischemia.   Recent Labs: 04/07/2016: ALT 9*; BUN 20; Creatinine, Ser 1.94*; Hemoglobin 14.3; Platelets 195; Potassium 4.3; Sodium 141    Lipid Panel No results found for: CHOL, TRIG, HDL, CHOLHDL, VLDL, LDLCALC, LDLDIRECT    Wt Readings from Last 3 Encounters:  05/05/16 166 lb (75.297 kg)  05/03/16 165 lb (74.844 kg)  04/13/16 165 lb (74.844 kg)       ASSESSMENT AND PLAN:  1.  Preoperative cardiovascular evaluation: The patient has no symptoms suggestive of angina, heart  failure or arrhythmia. Obviously, he does have chronic dyspnea related to COPD but he does not feel limited overall. Recent EKG showed PVCs and today's EKG shows ectopic atrial rhythm with inferior T wave changes suggestive of ischemia.  I do not think a stress test is going to be helpful in his situation. I requested an echocardiogram just to check his LV systolic function and pulmonary pressure and if these come back unremarkable, he can proceed at that overall low risk. Obviously, given his age and extensive tobacco use, he is going to be at moderate risk for cardiovascular complications.  2. Essential hypertension: Blood pressure is reasonably controlled on current medications.  3. COPD with suspected lung cancer: The patient is not interested in chemotherapy even if cancer is diagnosed    Disposition:   FU with me as needed.   Signed,  Lorine BearsMuhammad Tawan Corkern, MD  05/05/2016 12:57 PM    [facility]

## 2016-05-05 NOTE — Patient Instructions (Addendum)
Medication Instructions:  Your physician recommends that you continue on your current medications as directed. Please refer to the Current Medication list given to you today.   Labwork: none  Testing/Procedures: Your physician has requested that you have an echocardiogram. Echocardiography is a painless test that uses sound waves to create images of your heart. It provides your doctor with information about the size and shape of your heart and how well your heart's chambers and valves are working. This procedure takes approximately one hour. There are no restrictions for this procedure.  Echocardiogram  July 11. Check in at 9:45am Medical Mall entrance [facility]   Follow-Up: Your physician recommends that you schedule a follow-up appointment as needed.   Any Other Special Instructions Will Be Listed Below (If Applicable).     If you need a refill on your cardiac medications before your next appointment, please call your pharmacy.  Echocardiogram An echocardiogram, or echocardiography, uses sound waves (ultrasound) to produce an image of your heart. The echocardiogram is simple, painless, obtained within a short period of time, and offers valuable information to your health care provider. The images from an echocardiogram can provide information such as:  Evidence of coronary artery disease (CAD).  Heart size.  Heart muscle function.  Heart valve function.  Aneurysm detection.  Evidence of a past heart attack.  Fluid buildup around the heart.  Heart muscle thickening.  Assess heart valve function. LET [facility] PROVID[facility] KNOW ABOUT:  Any allergies you have.  All medicines you are taking, including vitamins, herbs, eye drops, creams, and over-the-counter medicines.  Previous problems you or members of your family have had with the use of anesthetics.  Any blood disorders you have.  Previous surgeries you have had.  Medical conditions you  have.  Possibility of pregnancy, if this applies. BEFORE THE PROCEDURE  No special preparation is needed. Eat and drink normally.  PROCEDURE   In order to produce an image of your heart, gel will be applied to your chest and a wand-like tool (transducer) will be moved over your chest. The gel will help transmit the sound waves from the transducer. The sound waves will harmlessly bounce off your heart to allow the heart images to be captured in real-time motion. These images will then be recorded.  You may need an IV to receive a medicine that improves the quality of the pictures. AFTER THE PROCEDURE You may return to your normal schedule including diet, activities, and medicines, unless your health care provider tells you otherwise.   This information is not intended to replace advice given to you by your health care provider. Make sure you discuss any questions you have with your health care provider.   Document Released: 10/13/2000 Document Revised: 11/06/2014 Document Reviewed: 06/23/2013 Elsevier Interactive Patient Education [employer]2016 Else[employer] Inc.

## 2016-05-08 ENCOUNTER — Telehealth: Payer: Self-pay | Admitting: *Deleted

## 2016-05-08 NOTE — Telephone Encounter (Signed)
Called pt to give him ONO results and that he needs 2L O2 QHS. Pt states he doesn't feel he needs the O2 and that he sleeps well at night. FYI. Will have results scanned into chart.

## 2016-05-08 NOTE — Telephone Encounter (Signed)
Called pt with ONO results and sent message to [facility]K due to pt not wanting to use the O2 for QHS. Nothing further needed.

## 2016-05-09 ENCOUNTER — Ambulatory Visit
Admission: RE | Admit: 2016-05-09 | Discharge: 2016-05-09 | Disposition: A | Discharge status: Home / Self Care | Payer: Medicare Other | Source: Ambulatory Visit | Attending: Cardiovascular Disease | Admitting: Cardiovascular Disease

## 2016-05-09 ENCOUNTER — Telehealth: Payer: Self-pay | Admitting: Cardiovascular Disease

## 2016-05-09 DIAGNOSIS — N189 Chronic kidney disease, unspecified: Secondary | ICD-10-CM | POA: Insufficient documentation

## 2016-05-09 DIAGNOSIS — I340 Nonrheumatic mitral (valve) insufficiency: Secondary | ICD-10-CM | POA: Diagnosis not present

## 2016-05-09 DIAGNOSIS — K219 Gastro-esophageal reflux disease without esophagitis: Secondary | ICD-10-CM | POA: Insufficient documentation

## 2016-05-09 DIAGNOSIS — I1310 Hypertensive heart and chronic kidney disease without heart failure, with stage 1 through stage 4 chronic kidney disease, or unspecified chronic kidney disease: Secondary | ICD-10-CM | POA: Diagnosis not present

## 2016-05-09 DIAGNOSIS — R0600 Dyspnea, unspecified: Secondary | ICD-10-CM | POA: Diagnosis present

## 2016-05-09 DIAGNOSIS — R0602 Shortness of breath: Secondary | ICD-10-CM | POA: Diagnosis not present

## 2016-05-09 DIAGNOSIS — J449 Chronic obstructive pulmonary disease, unspecified: Secondary | ICD-10-CM | POA: Insufficient documentation

## 2016-05-09 NOTE — Pre-Procedure Instructions (Addendum)
ANESTHESIA - CARDIOLOGY NOTE/RISK ASSESSMENT PENDING ECHO 05/09/16  Progress Notes   Tommy Jordan (MR# [id number])      Progress Notes Info    Author Note Status Last Update User Last Update Date/Time   Iran OuchMuhammad A Arida, MD Signed Iran OuchMuhammad A Arida, MD 05/05/2016 1:02 PM    Progress Notes    Expand All Collapse All      Cardiology Office Note   Date: 05/05/2016   ID: Tommy Jordan, DOB 05/21/1932, MRN [id number]  PCP: Barbette ReichmannHANDE,VISHWANATH, MD Cardiologist: Lorine BearsMuhammad Arida, MD   Chief Complaint  Patient presents with  . New Evaluation    abnormal ekg     History of Present Illness: Tommy Jordan is a 82 y.o. male who was referred for preoperative cardiovascular evaluation before bronchoscopy and lung biopsy and possible need for surgery. He was found to have an abnormal EKG. He has no previous cardiac history but has known history of hypertension and prolonged history of tobacco use with active smoking of 2 packs per day for the last 65 years. He was recently diagnosed with COPD and lung mass and is suspected of having lung cancer. He denies any chest pain, shortness of breath, orthopnea or PND. No leg edema. He reports being able to perform activities of daily living around the house but he does not stress himself much. He was noted recently to have PVCs on his EKG but he denies any palpitations, syncope or presyncope.     Past Medical History  Diagnosis Date  . Neuromuscular disorder (HCC)     sciatica  . Hypertension   . COPD (chronic obstructive pulmonary disease) (HCC)   . GERD (gastroesophageal reflux disease)   . Chronic kidney disease     RENAL INSUFFICIENCY    Past Surgical History  Procedure Laterality Date  . Neck surgery       Current Outpatient Prescriptions  Medication Sig Dispense Refill  . albuterol (PROVENTIL HFA;VENTOLIN HFA) 108 (90 Base) MCG/ACT inhaler Inhale 2 puffs  into the lungs every 4 (four) hours as needed for wheezing or shortness of breath. 1 Inhaler 8  . amLODipine (NORVASC) 2.5 MG tablet Take 2.5 mg by mouth daily.    . fluticasone furoate-vilanterol (BREO ELLIPTA) 200-25 MCG/INH AEPB Inhale 1 puff into the lungs daily. 60 each 5  . gabapentin (NEURONTIN) 300 MG capsule Take 300 mg by mouth 2 (two) times daily.    Marland Kitchen. HYDROcodone-acetaminophen (NORCO/VICODIN) 5-325 MG tablet Take 1 tablet by mouth 2 (two) times daily.    Marland Kitchen. lisinopril (PRINIVIL,ZESTRIL) 40 MG tablet Take 40 mg by mouth daily.     No current facility-administered medications for this visit.    Allergies: Review of patient's allergies indicates no known allergies.    Social History: The patient  reports that he has been smoking. He has never used smokeless tobacco. He reports that he drinks alcohol. He reports that he does not use illicit drugs.   Family History: The patient's family history includes Cancer in his father and mother.    ROS: Please see the history of present illness. Otherwise, review of systems are positive for none. All other systems are reviewed and negative.    PHYSICAL EXAM: VS: BP 160/90 mmHg  Pulse 77  Ht 5\' 11"  (1.803 m)  Wt 166 lb (75.297 kg)  BMI 23.16 kg/m2 , BMI Body mass index is 23.16 kg/(m^2). GEN: Well nourished, well developed, in no acute distress  HEENT: normal  Neck: no JVD, carotid bruits, or  masses Cardiac: RRR; no murmurs, rubs, or gallops,no edema  Respiratory: clear to auscultation bilaterally, normal work of breathing GI: soft, nontender, nondistended, + BS MS: no deformity or atrophy  Skin: warm and dry, no rash Neuro: Strength and sensation are intact Psych: euthymic mood, full affect   EKG: EKG is ordered today. The ekg ordered today demonstrates ectopic atrial rhythm with inferior T wave changes suggestive of ischemia.   Recent Labs: 04/07/2016: ALT 9*; BUN 20; Creatinine,  Ser 1.94*; Hemoglobin 14.3; Platelets 195; Potassium 4.3; Sodium 141    Lipid Panel  Labs (Brief)    No results found for: CHOL, TRIG, HDL, CHOLHDL, VLDL, LDLCALC, LDLDIRECT     Wt Readings from Last 3 Encounters:  05/05/16 166 lb (75.297 kg)  05/03/16 165 lb (74.844 kg)  04/13/16 165 lb (74.844 kg)       ASSESSMENT AND PLAN:  1. Preoperative cardiovascular evaluation: The patient has no symptoms suggestive of angina, heart failure or arrhythmia. Obviously, he does have chronic dyspnea related to COPD but he does not feel limited overall. Recent EKG showed PVCs and today's EKG shows ectopic atrial rhythm with inferior T wave changes suggestive of ischemia.  I do not think a stress test is going to be helpful in his situation. I requested an echocardiogram just to check his LV systolic function and pulmonary pressure and if these come back unremarkable, he can proceed at that overall low risk. Obviously, given his age and extensive tobacco use, he is going to be at moderate risk for cardiovascular complications.  2. Essential hypertension: Blood pressure is reasonably controlled on current medications.  3. COPD with suspected lung cancer: The patient is not interested in chemotherapy even if cancer is diagnosed    Disposition: FU with me as needed.   Signed,  Lorine Bears, MD  05/05/2016 12:57 PM  [facility]

## 2016-05-09 NOTE — Progress Notes (Signed)
*  PRELIMINARY RESULTS* Echocardiogram 2D Echocardiogram has been performed.  Cristela BlueHege, Akyah Lagrange 05/09/2016, 10:35 AM

## 2016-05-09 NOTE — Telephone Encounter (Signed)
Patient had echo today for clearance on 7/13 procedure please call Heather at [facility] [phone] to discuss note from reader.

## 2016-05-09 NOTE — Telephone Encounter (Signed)
[place]. Left message for her to contact the office.

## 2016-05-11 ENCOUNTER — Ambulatory Visit: Payer: Medicare Other | Admitting: Certified Registered Nurse Anesthetist

## 2016-05-11 ENCOUNTER — Other Ambulatory Visit: Payer: Self-pay

## 2016-05-11 ENCOUNTER — Ambulatory Visit
Admission: RE | Admit: 2016-05-11 | Discharge: 2016-05-11 | Disposition: A | Discharge status: Home / Self Care | Payer: Medicare Other | Source: Ambulatory Visit | Attending: Internal Medicine | Admitting: Internal Medicine

## 2016-05-11 ENCOUNTER — Encounter: Payer: Self-pay | Admitting: *Deleted

## 2016-05-11 ENCOUNTER — Encounter
Admission: RE | Disposition: A | Discharge status: Home / Self Care | Payer: Self-pay | Source: Ambulatory Visit | Attending: Internal Medicine

## 2016-05-11 DIAGNOSIS — Z5329 Procedure and treatment not carried out because of patient's decision for other reasons: Secondary | ICD-10-CM | POA: Insufficient documentation

## 2016-05-11 DIAGNOSIS — R918 Other nonspecific abnormal finding of lung field: Secondary | ICD-10-CM | POA: Insufficient documentation

## 2016-05-11 DIAGNOSIS — J9690 Respiratory failure, unspecified, unspecified whether with hypoxia or hypercapnia: Secondary | ICD-10-CM | POA: Insufficient documentation

## 2016-05-11 DIAGNOSIS — J432 Centrilobular emphysema: Secondary | ICD-10-CM

## 2016-05-11 DIAGNOSIS — J439 Emphysema, unspecified: Secondary | ICD-10-CM | POA: Insufficient documentation

## 2016-05-11 HISTORY — PX: ELECTROMAGNETIC NAVIGATION BROCHOSCOPY: SHX5369

## 2016-05-11 SURGERY — ELECTROMAGNETIC NAVIGATION BRONCHOSCOPY
Anesthesia: General

## 2016-05-11 MED ORDER — FAMOTIDINE 20 MG PO TABS
ORAL_TABLET | ORAL | Status: AC
  Filled 2016-05-11: qty 1

## 2016-05-11 MED ORDER — LACTATED RINGERS IV SOLN: INTRAVENOUS | Status: DC

## 2016-05-11 MED ORDER — FAMOTIDINE 20 MG PO TABS: 20.0000 mg | ORAL_TABLET | Freq: Once | ORAL | Status: DC

## 2016-05-11 NOTE — Consult Note (Signed)
After further discussion with Patient and Family, I have discussed and reviewed CT scan of Chest in details and have shown that patient has 2 lung masses that are very highly suggestive of Lung cancer.   The patient has stated he does NOT want any type of chemo or radiation therapy to his body.  He wants to live the rest of his life comfortably.  After further assessment, patient also has new dx of chronic hypoxia  resp failure requiring and needing oxygen at night due to his underlying emphysema and he is at very high risk for intra-operative and post -operative cardiac and pulmonary complications.  The patient and family understand the risks and benefits and also understand the consequences of not getting treatment. They are satisfied with plan of action. They have decided NOT to proceed with ENB.   Will cancel ENB, will prescribe nocturnal oxygen therapy as previously recommended. Patient and family in agreement.    Patient/Family are satisfied with Plan of action and management. All questions answered  Lucie LeatherKurian David Baily Hovanec, M.D.  [facility]  Medical Director [facility] Medical Director [facility] Cardio[facility]Pulmonary Department

## 2016-05-11 NOTE — Anesthesia Preprocedure Evaluation (Signed)
Anesthesia Evaluation  Patient identified by MRN, date of birth, ID band Patient awake    Reviewed: Allergy & Precautions, NPO status , Patient's Chart, lab work & pertinent test results, reviewed documented beta blocker date and time   Airway Mallampati: II  TM Distance: >3 FB     Dental  (+) Chipped   Pulmonary COPD, Current Smoker,           Cardiovascular hypertension, Pt. on medications      Neuro/Psych  Neuromuscular disease    GI/Hepatic GERD  Controlled,  Endo/Other    Renal/GU Renal InsufficiencyRenal disease     Musculoskeletal   Abdominal   Peds  Hematology   Anesthesia Other Findings Hypertensive this am. Neck movement OK.  Reproductive/Obstetrics                             Anesthesia Physical Anesthesia Plan  ASA: III  Anesthesia Plan: General   Post-op Pain Management:    Induction: Intravenous  Airway Management Planned: Oral ETT  Additional Equipment:   Intra-op Plan:   Post-operative Plan:   Informed Consent: I have reviewed the patients History and Physical, chart, labs and discussed the procedure including the risks, benefits and alternatives for the proposed anesthesia with the patient or authorized representative who has indicated his/her understanding and acceptance.     Plan Discussed with: CRNA  Anesthesia Plan Comments:         Anesthesia Quick Evaluation

## 2016-05-12 ENCOUNTER — Encounter: Payer: Self-pay | Admitting: Internal Medicine

## 2016-05-16 ENCOUNTER — Ambulatory Visit: Payer: Medicare Other | Admitting: Oncology

## 2016-06-29 ENCOUNTER — Ambulatory Visit: Payer: Medicare Other

## 2016-07-04 ENCOUNTER — Ambulatory Visit (INDEPENDENT_AMBULATORY_CARE_PROVIDER_SITE_OTHER): Payer: Medicare Other | Admitting: Internal Medicine

## 2016-07-04 ENCOUNTER — Encounter: Payer: Self-pay | Admitting: Internal Medicine

## 2016-07-04 VITALS — BP 138/70 | HR 109 | Ht 71.0 in | Wt 166.8 lb

## 2016-07-04 DIAGNOSIS — J449 Chronic obstructive pulmonary disease, unspecified: Secondary | ICD-10-CM | POA: Diagnosis not present

## 2016-07-04 DIAGNOSIS — J9611 Chronic respiratory failure with hypoxia: Secondary | ICD-10-CM

## 2016-07-04 MED ORDER — PREDNISONE 20 MG PO TABS: 20.0000 mg | ORAL_TABLET | Freq: Every day | ORAL | 2 refills | Status: DC

## 2016-07-04 MED ORDER — TIOTROPIUM BROMIDE MONOHYDRATE 2.5 MCG/ACT IN AERS: 2.0000 | INHALATION_SPRAY | Freq: Every day | RESPIRATORY_TRACT | 0 refills | Status: DC

## 2016-07-04 NOTE — Patient Instructions (Signed)
Start respimat Start prednisone 20 mg daily Follow up in 1 month   Chronic Obstructive Pulmonary Disease Chronic obstructive pulmonary disease (COPD) is a common lung condition in which airflow from the lungs is limited. COPD is a general term that can be used to describe many different lung problems that limit airflow, including both chronic bronchitis and emphysema. If you have COPD, your lung function will probably never return to normal, but there are measures you can take to improve lung function and make yourself feel better. CAUSES   Smoking (common).  Exposure to secondhand smoke.  Genetic problems.  Chronic inflammatory lung diseases or recurrent infections. SYMPTOMS  Shortness of breath, especially with physical activity.  Deep, persistent (chronic) cough with a large amount of thick mucus.  Wheezing.  Rapid breaths (tachypnea).  Gray or bluish discoloration (cyanosis) of the skin, especially in your fingers, toes, or lips.  Fatigue.  Weight loss.  Frequent infections or episodes when breathing symptoms become much worse (exacerbations).  Chest tightness. DIAGNOSIS Your health care provider will take a medical history and perform a physical examination to diagnose COPD. Additional tests for COPD may include:  Lung (pulmonary) function tests.  Chest X-ray.  CT scan.  Blood tests. TREATMENT  Treatment for COPD may include:  Inhaler and nebulizer medicines. These help manage the symptoms of COPD and make your breathing more comfortable.  Supplemental oxygen. Supplemental oxygen is only helpful if you have a low oxygen level in your blood.  Exercise and physical activity. These are beneficial for nearly all people with COPD.  Lung surgery or transplant.  Nutrition therapy to gain weight, if you are underweight.  Pulmonary rehabilitation. This may involve working with a team of health care providers and specialists, such as respiratory, occupational, and  physical therapists. HOME CARE INSTRUCTIONS  Take all medicines (inhaled or pills) as directed by your health care provider.  Avoid over-the-counter medicines or cough syrups that dry up your airway (such as antihistamines) and slow down the elimination of secretions unless instructed otherwise by your health care provider.  If you are a smoker, the most important thing that you can do is stop smoking. Continuing to smoke will cause further lung damage and breathing trouble. Ask your health care provider for help with quitting smoking. He or she can direct you to community resources or hospitals that provide support.  Avoid exposure to irritants such as smoke, chemicals, and fumes that aggravate your breathing.  Use oxygen therapy and pulmonary rehabilitation if directed by your health care provider. If you require home oxygen therapy, ask your health care provider whether you should purchase a pulse oximeter to measure your oxygen level at home.  Avoid contact with individuals who have a contagious illness.  Avoid extreme temperature and humidity changes.  Eat healthy foods. Eating smaller, more frequent meals and resting before meals may help you maintain your strength.  Stay active, but balance activity with periods of rest. Exercise and physical activity will help you maintain your ability to do things you want to do.  Preventing infection and hospitalization is very important when you have COPD. Make sure to receive all the vaccines your health care provider recommends, especially the pneumococcal and influenza vaccines. Ask your health care provider whether you need a pneumonia vaccine.  Learn and use relaxation techniques to manage stress.  Learn and use controlled breathing techniques as directed by your health care provider. Controlled breathing techniques include:  Pursed lip breathing. Start by breathing in (inhaling)  through your nose for 1 second. Then, purse your lips as if  you were going to whistle and breathe out (exhale) through the pursed lips for 2 seconds.  Diaphragmatic breathing. Start by putting one hand on your abdomen just above your waist. Inhale slowly through your nose. The hand on your abdomen should move out. Then purse your lips and exhale slowly. You should be able to feel the hand on your abdomen moving in as you exhale.  Learn and use controlled coughing to clear mucus from your lungs. Controlled coughing is a series of short, progressive coughs. The steps of controlled coughing are: 1. Lean your head slightly forward. 2. Breathe in deeply using diaphragmatic breathing. 3. Try to hold your breath for 3 seconds. 4. Keep your mouth slightly open while coughing twice. 5. Spit any mucus out into a tissue. 6. Rest and repeat the steps once or twice as needed. SEEK MEDICAL CARE IF:  You are coughing up more mucus than usual.  There is a change in the color or thickness of your mucus.  Your breathing is more labored than usual.  Your breathing is faster than usual. SEEK IMMEDIATE MEDICAL CARE IF:  You have shortness of breath while you are resting.  You have shortness of breath that prevents you from:  Being able to talk.  Performing your usual physical activities.  You have chest pain lasting longer than 5 minutes.  Your skin color is more cyanotic than usual.  You measure low oxygen saturations for longer than 5 minutes with a pulse oximeter. MAKE SURE YOU:  Understand these instructions.  Will watch your condition.  Will get help right away if you are not doing well or get worse.   This information is not intended to replace advice given to you by your health care provider. Make sure you discuss any questions you have with your health care provider.   Document Released: 07/26/2005 Document Revised: 11/06/2014 Document Reviewed: 06/12/2013 Elsevier Interactive Patient Education [employer]

## 2016-07-04 NOTE — Progress Notes (Signed)
[facility] Pulmonary Medicine Consultation      Date: 07/04/2016,   MRN# [id number] Tommy Jordan Aug 25, 1932 Code Status:  Code Status History    This patient does not have a recorded code status. Please follow your organizational policy for patients in this situation.     Hosp day:@LENGTHOFSTAYDAYS @ Referring MD: @ATDPROV @     PCP:      AdmissionWeight: 166 lb 12.8 oz (75.7 kg)                 CurrentWeight: 166 lb 12.8 oz (75.7 kg) Tommy Jordan is a 82 y.o. old male seen in consultation for lung mass at the request of Dr. Fidela Juneau.     CHIEF COMPLAINT:   abnormal CT chest   HISTORY OF PRESENT ILLNESS   82 yo white male seen today for abnormal CT chest. Patient primary care physician obtained routine CXR which led to CT chest There is a LUL mass and RUL nodule, also evidence of emphysema b/l lungs  Patient is long time and current smoker 2 ppd for 65 years Patient has chronic SOB and DOE for many years, he has no wheezing and no chest pain Patient has persistent chronic intermittent cough with sputum production for many years  Has no signs of infection at this time, no lower ext swelling   Follow up appointment today-patient refused and PFT's He has also stopped BREO cant afford it  Patient has 2 lung masses-HE HAS DECIDED NOT TO PURSUE ANY FURTHER WORK UP AND DOES NOT WANT ANY KIND OF THERAPY     Current Medication:  Current Outpatient Prescriptions:  .  albuterol (PROVENTIL HFA;VENTOLIN HFA) 108 (90 Base) MCG/ACT inhaler, Inhale 2 puffs into the lungs every 4 (four) hours as needed for wheezing or shortness of breath. (Patient not taking: Reported on 05/11/2016), Disp: 1 Inhaler, Rfl: 8 .  amLODipine (NORVASC) 2.5 MG tablet, Take 2.5 mg by mouth daily., Disp: , Rfl:  .  fluticasone furoate-vilanterol (BREO ELLIPTA) 200-25 MCG/INH AEPB, Inhale 1 puff into the lungs daily., Disp: 60 each, Rfl: 5 .  gabapentin (NEURONTIN) 300 MG capsule, Take 300 mg  by mouth 2 (two) times daily., Disp: , Rfl:  .  HYDROcodone-acetaminophen (NORCO/VICODIN) 5-325 MG tablet, Take 1 tablet by mouth 2 (two) times daily., Disp: , Rfl:  .  lisinopril (PRINIVIL,ZESTRIL) 40 MG tablet, Take 40 mg by mouth daily., Disp: , Rfl:     ALLERGIES   Review of patient's allergies indicates no known allergies.     REVIEW OF SYSTEMS   Review of Systems  Constitutional: Negative for chills, diaphoresis, fever, malaise/fatigue and weight loss.  HENT: Negative for congestion and hearing loss.   Respiratory: Positive for cough, sputum production and shortness of breath. Negative for hemoptysis and wheezing.   Cardiovascular: Negative for chest pain, palpitations and orthopnea.  Gastrointestinal: Negative for abdominal pain, heartburn, nausea and vomiting.  Skin: Negative for rash.  Neurological: Negative for weakness and headaches.  All other systems reviewed and are negative.    VS: BP 138/70 (BP Location: Left Arm, Cuff Size: Normal)   Pulse (!) 109   Ht 5\' 11"  (1.803 m)   Wt 166 lb 12.8 oz (75.7 kg)   SpO2 100%   BMI 23.26 kg/m       PHYSICAL EXAM  Physical Exam  Constitutional: He is oriented to person, place, and time. No distress.  HENT:  Mouth/Throat: No oropharyngeal exudate.  Eyes: No scleral icterus.  Neck: Neck supple.  Cardiovascular: Normal rate, regular rhythm and normal heart sounds.   No murmur heard. Pulmonary/Chest: Effort normal and breath sounds normal. No stridor. No respiratory distress. He has no wheezes. He has no rales.  barrel chest  Musculoskeletal: Normal range of motion. He exhibits no edema.  Neurological: He is alert and oriented to person, place, and time. No cranial nerve deficit.  Skin: Skin is warm. He is not diaphoretic.  Psychiatric: He has a normal mood and affect.        Images revi  ASSESSMENT/PLAN   82 yo white male seen today chronic hypoxic resp failure with evidence of COPD with severe emphysema with  Chronic bronchitis Gold Stage C with   new findings of LUL mass and RUL nodule likely c/w primary  lung cancer.   The Patient HAS REFUSED TO UNDERGO FURTHER DIAGNOSTIC TESTING AT THIS TIME and DOES NOT WANT ANY CHEMO OR RADIATION THERAPY  HE WANTS TO LIVE OUT THE REST OF HIS LIFE PEACEFULLY   1.start respimat as breo does not seem to help his breathing 2.continue oxygen 2 L [place] at night 3.will start prednisone therapy 20 mg daily for chronic resp issues    Follow up in 1 month  The Patient requires high complexity decision making for assessment and support, frequent evaluation and titration of therapies, application of advanced monitoring technologies and extensive interpretation of multiple databases.   Patient/Family are satisfied with Plan of action and management. All questions answered  Lucie LeatherKurian David Aki Abalos, M.D.  [facility]  Medical Director [facility] Medical Director [facility]

## 2016-07-04 NOTE — Progress Notes (Signed)
Patient ID: Tommy Jordan, male   DOB: 02/23/1932, 82 y.o.   MRN: [id number] Patient seen in the office today and instructed on use of spiriva respimat.  Patient expressed understanding and demonstrated technique.

## 2016-08-07 ENCOUNTER — Telehealth: Payer: Self-pay | Admitting: Internal Medicine

## 2016-08-07 MED ORDER — TIOTROPIUM BROMIDE MONOHYDRATE 2.5 MCG/ACT IN AERS
2.0000 | INHALATION_SPRAY | Freq: Every day | RESPIRATORY_TRACT | 5 refills | Status: AC
Start: 2016-08-07 — End: ?

## 2016-08-07 NOTE — Telephone Encounter (Signed)
Pt daughter calling stating that pt needs some more samples of Spriva   Please advise

## 2016-08-07 NOTE — Telephone Encounter (Signed)
lmom for daughter letting her know rx for Barnetta ChapelSpirva has been sent to the pharmacy. Nothing further needed

## 2016-08-17 ENCOUNTER — Ambulatory Visit (INDEPENDENT_AMBULATORY_CARE_PROVIDER_SITE_OTHER): Payer: Medicare Other | Admitting: Internal Medicine

## 2016-08-17 ENCOUNTER — Encounter: Payer: Self-pay | Admitting: Internal Medicine

## 2016-08-17 VITALS — BP 140/72 | HR 105 | Ht 70.0 in | Wt 171.0 lb

## 2016-08-17 DIAGNOSIS — J449 Chronic obstructive pulmonary disease, unspecified: Secondary | ICD-10-CM

## 2016-08-17 MED ORDER — GLYCOPYRROLATE-FORMOTEROL 9-4.8 MCG/ACT IN AERO
2.0000 | INHALATION_SPRAY | Freq: Two times a day (BID) | RESPIRATORY_TRACT | 0 refills | Status: AC
Start: 2016-08-17 — End: 2016-08-18

## 2016-08-17 MED ORDER — ALPRAZOLAM 0.5 MG PO TABS: 0.5000 mg | ORAL_TABLET | Freq: Three times a day (TID) | ORAL | 2 refills | Status: DC | PRN

## 2016-08-17 MED ORDER — GLYCOPYRROLATE-FORMOTEROL 9-4.8 MCG/ACT IN AERO: 2.0000 | INHALATION_SPRAY | Freq: Two times a day (BID) | RESPIRATORY_TRACT | 0 refills | Status: AC

## 2016-08-17 NOTE — Addendum Note (Signed)
Addended by: Maxwell MarionBLANKENSHIP, MARGIE A on: 08/17/2016 10:27 AM   Modules accepted: Orders

## 2016-08-17 NOTE — Progress Notes (Signed)
Patient ID: Tommy Jordan, male   DOB: 08/27/1932, 82 y.o.   MRN: [id number] Patient seen in the office today and instructed on use of bevespi Areoshere.  Patient expressed understanding and demonstrated technique.

## 2016-08-17 NOTE — Patient Instructions (Signed)
Xanax as needed Start Bevespi Oxygen as needed Follow up in 3 months  Chronic Obstructive Pulmonary Disease Chronic obstructive pulmonary disease (COPD) is a common lung condition in which airflow from the lungs is limited. COPD is a general term that can be used to describe many different lung problems that limit airflow, including both chronic bronchitis and emphysema. If you have COPD, your lung function will probably never return to normal, but there are measures you can take to improve lung function and make yourself feel better. CAUSES   Smoking (common).  Exposure to secondhand smoke.  Genetic problems.  Chronic inflammatory lung diseases or recurrent infections. SYMPTOMS  Shortness of breath, especially with physical activity.  Deep, persistent (chronic) cough with a large amount of thick mucus.  Wheezing.  Rapid breaths (tachypnea).  Gray or bluish discoloration (cyanosis) of the skin, especially in your fingers, toes, or lips.  Fatigue.  Weight loss.  Frequent infections or episodes when breathing symptoms become much worse (exacerbations).  Chest tightness. DIAGNOSIS Your health care provider will take a medical history and perform a physical examination to diagnose COPD. Additional tests for COPD may include:  Lung (pulmonary) function tests.  Chest X-ray.  CT scan.  Blood tests. TREATMENT  Treatment for COPD may include:  Inhaler and nebulizer medicines. These help manage the symptoms of COPD and make your breathing more comfortable.  Supplemental oxygen. Supplemental oxygen is only helpful if you have a low oxygen level in your blood.  Exercise and physical activity. These are beneficial for nearly all people with COPD.  Lung surgery or transplant.  Nutrition therapy to gain weight, if you are underweight.  Pulmonary rehabilitation. This may involve working with a team of health care providers and specialists, such as respiratory, occupational,  and physical therapists. HOME CARE INSTRUCTIONS  Take all medicines (inhaled or pills) as directed by your health care provider.  Avoid over-the-counter medicines or cough syrups that dry up your airway (such as antihistamines) and slow down the elimination of secretions unless instructed otherwise by your health care provider.  If you are a smoker, the most important thing that you can do is stop smoking. Continuing to smoke will cause further lung damage and breathing trouble. Ask your health care provider for help with quitting smoking. He or she can direct you to community resources or hospitals that provide support.  Avoid exposure to irritants such as smoke, chemicals, and fumes that aggravate your breathing.  Use oxygen therapy and pulmonary rehabilitation if directed by your health care provider. If you require home oxygen therapy, ask your health care provider whether you should purchase a pulse oximeter to measure your oxygen level at home.  Avoid contact with individuals who have a contagious illness.  Avoid extreme temperature and humidity changes.  Eat healthy foods. Eating smaller, more frequent meals and resting before meals may help you maintain your strength.  Stay active, but balance activity with periods of rest. Exercise and physical activity will help you maintain your ability to do things you want to do.  Preventing infection and hospitalization is very important when you have COPD. Make sure to receive all the vaccines your health care provider recommends, especially the pneumococcal and influenza vaccines. Ask your health care provider whether you need a pneumonia vaccine.  Learn and use relaxation techniques to manage stress.  Learn and use controlled breathing techniques as directed by your health care provider. Controlled breathing techniques include:  Pursed lip breathing. Start by breathing in (inhaling)  through your nose for 1 second. Then, purse your lips as  if you were going to whistle and breathe out (exhale) through the pursed lips for 2 seconds.  Diaphragmatic breathing. Start by putting one hand on your abdomen just above your waist. Inhale slowly through your nose. The hand on your abdomen should move out. Then purse your lips and exhale slowly. You should be able to feel the hand on your abdomen moving in as you exhale.  Learn and use controlled coughing to clear mucus from your lungs. Controlled coughing is a series of short, progressive coughs. The steps of controlled coughing are: 1. Lean your head slightly forward. 2. Breathe in deeply using diaphragmatic breathing. 3. Try to hold your breath for 3 seconds. 4. Keep your mouth slightly open while coughing twice. 5. Spit any mucus out into a tissue. 6. Rest and repeat the steps once or twice as needed. SEEK MEDICAL CARE IF:  You are coughing up more mucus than usual.  There is a change in the color or thickness of your mucus.  Your breathing is more labored than usual.  Your breathing is faster than usual. SEEK IMMEDIATE MEDICAL CARE IF:  You have shortness of breath while you are resting.  You have shortness of breath that prevents you from:  Being able to talk.  Performing your usual physical activities.  You have chest pain lasting longer than 5 minutes.  Your skin color is more cyanotic than usual.  You measure low oxygen saturations for longer than 5 minutes with a pulse oximeter. MAKE SURE YOU:  Understand these instructions.  Will watch your condition.  Will get help right away if you are not doing well or get worse.   This information is not intended to replace advice given to you by your health care provider. Make sure you discuss any questions you have with your health care provider.   Document Released: 07/26/2005 Document Revised: 11/06/2014 Document Reviewed: 06/12/2013 Elsevier Interactive Patient Education [employer].

## 2016-08-17 NOTE — Progress Notes (Signed)
[facility] Pulmonary Medicine Consultation      Date: 08/17/2016,   MRN# [id number] Tommy Jordan 08/12/1932 Code Status:  Code Status History    This patient does not have a recorded code status. Please follow your organizational policy for patients in this situation.     Hosp day:@LENGTHOFSTAYDAYS @ Referring MD: @ATDPROV @     PCP:      AdmissionWeight: 171 lb (77.6 kg)                 CurrentWeight: 171 lb (77.6 kg) Tommy Jordan is a 82 y.o. old male seen in consultation for lung mass at the request of Dr. Fidela Juneau.     CHIEF COMPLAINT:   abnormal CT chest   HISTORY OF PRESENT ILLNESS   82 yo white male seen today for abnormal CT chest. Patient primary care physician obtained routine CXR which led to CT chest There is a LUL mass and RUL nodule, also evidence of emphysema b/l lungs  Patient is long time and current smoker 2 ppd for 65 years Patient has chronic SOB and DOE for many years, he has no wheezing and no chest pain Patient has persistent chronic intermittent cough with sputum production for many years  Has no signs of infection at this time, no lower ext swelling   Follow up appointment -patient refused and PFT's He has also stopped BREO cant afford it patient stopped taking Spiriva and BREO and Prednisone He states that they do not help  Patient has intermittent anxiety and problems sleeping as well  Patient has 2 lung masses-HE HAS DECIDED NOT TO PURSUE ANY FURTHER WORK UP AND DOES NOT WANT ANY KIND OF THERAPY   [name]  Current Medication:  Current Outpatient Prescriptions:  .  albuterol (PROVENTIL HFA;VENTOLIN HFA) 108 (90 Base) MCG/ACT inhaler, Inhale 2 puffs into the lungs every 4 (four) hours as needed for wheezing or shortness of breath., Disp: 1 Inhaler, Rfl: 8 .  amLODipine (NORVASC) 2.5 MG tablet, Take 2.5 mg by mouth daily., Disp: , Rfl:  .  gabapentin (NEURONTIN) 300 MG capsule, Take 300 mg by mouth 2 (two) times daily.,  Disp: , Rfl:  .  HYDROcodone-acetaminophen (NORCO/VICODIN) 5-325 MG tablet, Take 1 tablet by mouth 2 (two) times daily., Disp: , Rfl:  .  lisinopril (PRINIVIL,ZESTRIL) 40 MG tablet, Take 40 mg by mouth daily., Disp: , Rfl:  .  Tiotropium Bromide Monohydrate (SPIRIVA RESPIMAT) 2.5 MCG/ACT AERS, Inhale 2 puffs into the lungs daily., Disp: 1 Inhaler, Rfl: 5    ALLERGIES   Review of patient's allergies indicates no known allergies.     REVIEW OF SYSTEMS   Review of Systems  Constitutional: Negative for chills, diaphoresis, fever, malaise/fatigue and weight loss.  HENT: Negative for congestion and hearing loss.   Respiratory: Positive for cough, sputum production and shortness of breath. Negative for hemoptysis and wheezing.   Cardiovascular: Negative for chest pain, palpitations and orthopnea.  Gastrointestinal: Negative for abdominal pain, heartburn, nausea and vomiting.  Skin: Negative for rash.  Neurological: Negative for weakness and headaches.  All other systems reviewed and are negative.    VS: BP 140/72 (BP Location: Left Arm, Cuff Size: Normal)   Pulse (!) 105   Ht 5\' 10"  (1.778 m)   Wt 171 lb (77.6 kg)   SpO2 94%   BMI 24.54 kg/m      PHYSICAL EXAM  Physical Exam  Constitutional: He is oriented to person, place, and time. No distress.  HENT:  Mouth/Throat: No oropharyngeal exudate.  Eyes: No scleral icterus.  Neck: Neck supple.  Cardiovascular: Normal rate, regular rhythm and normal heart sounds.   No murmur heard. Pulmonary/Chest: Effort normal and breath sounds normal. No stridor. No respiratory distress. He has no wheezes. He has no rales.  barrel chest  Musculoskeletal: Normal range of motion. He exhibits no edema.  Neurological: He is alert and oriented to person, place, and time. No cranial nerve deficit.  Skin: Skin is warm. He is not diaphoretic.  Psychiatric: He has a normal mood and affect.        Images revi  ASSESSMENT/PLAN   82 yo white  male seen today chronic hypoxic resp failure with evidence of COPD with severe emphysema with Chronic bronchitis Gold Stage C with  new findings of LUL mass and RUL nodule likely c/w primary  lung cancer.   The Patient HAS REFUSED TO UNDERGO FURTHER DIAGNOSTIC TESTING AT THIS TIME and DOES NOT WANT ANY CHEMO OR RADIATION THERAPY  HE WANTS TO LIVE OUT THE REST OF HIS LIFE PEACEFULLY   1.start LAMA/LABA with Bevespi and assess resp status 2.continue oxygen 2 L [place] at night 3.will stop prednisone 4.xanax as needed   Follow up in 3 months  The Patient requires high complexity decision making for assessment and support, frequent evaluation and titration of therapies, application of advanced monitoring technologies and extensive interpretation of multiple databases.   Patient/Family are satisfied with Plan of action and management. All questions answered  Tommy Jordan, M.D.  [facility]  Medical Director [facility] Medical Director [facility]

## 2016-11-10 ENCOUNTER — Other Ambulatory Visit: Payer: Self-pay

## 2016-11-10 MED ORDER — ALPRAZOLAM 0.5 MG PO TABS: 0.5000 mg | ORAL_TABLET | Freq: Three times a day (TID) | ORAL | 2 refills | Status: AC | PRN

## 2016-12-22 ENCOUNTER — Ambulatory Visit (INDEPENDENT_AMBULATORY_CARE_PROVIDER_SITE_OTHER): Payer: Medicare Other | Admitting: Pulmonary Disease

## 2016-12-22 ENCOUNTER — Ambulatory Visit: Payer: Medicare Other | Admitting: Internal Medicine

## 2016-12-22 ENCOUNTER — Encounter: Payer: Self-pay | Admitting: Pulmonary Disease

## 2016-12-22 VITALS — BP 140/90 | HR 113 | Wt 165.0 lb

## 2016-12-22 DIAGNOSIS — R918 Other nonspecific abnormal finding of lung field: Secondary | ICD-10-CM

## 2016-12-22 DIAGNOSIS — F17200 Nicotine dependence, unspecified, uncomplicated: Secondary | ICD-10-CM

## 2016-12-22 DIAGNOSIS — J449 Chronic obstructive pulmonary disease, unspecified: Secondary | ICD-10-CM | POA: Diagnosis not present

## 2016-12-22 DIAGNOSIS — J432 Centrilobular emphysema: Secondary | ICD-10-CM

## 2016-12-22 NOTE — Patient Instructions (Signed)
Continue Spiriva inhaler daily Follow up in 3 months with Dr Belia HemanKasa in 3 months with chest Xray prior to that visit

## 2016-12-23 NOTE — Progress Notes (Signed)
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 No evidence of lymphadenopathy or other metastatic disease within the thorax. Moderate to severe emphysema PET 04/14/16: 1. Bilateral upper lobe hypermetabolic lung lesions, consistent with synchronous primary bronchogenic carcinomas. 2. Mild hypermetabolism corresponding to a right paratracheal node, favored to be benign/reactive (especially given retained fatty hilum). Similarly, right hilar and low-level hypermetabolism is without definite CT correlate and favored to be reactive. 3. No extrathoracic metastatic disease identified  INTERVAL HISTORY: No major events  SUBJ: This is a routine office follow up originally scheduled with Dr Belia HemanKasa. Pt has no new complaints or problems. Remains moderately impaired with DOE. Denies CP, fever, purulent sputum, hemoptysis, LE edema and calf tenderness. Previously prescribed Bevespi. Now on Spiriva which he thinks might help reduce his DOE   OBJ: Vitals:   12/22/16 1030 12/22/16 1031  BP:  140/90  Pulse:  (!) 113  SpO2:  93%  Weight: 165 lb (74.8 kg)   RA  Elderly, NAD HEENT WNL BS diminished throughout without wheezes Reg, no M NABS, soft No C/C/E  DATA: BMP Latest Ref Rng & Units 04/07/2016 03/30/2016  Glucose 65 - 99 mg/dL 80 -  BUN 6 - 20 mg/dL 20 -  Creatinine 6.210.61 - 1.24 mg/dL 3.08(M1.94(H) 5.78(I1.70(H)  Sodium 135 - 145 mmol/L 141 -  Potassium 3.5 - 5.1 mmol/L 4.3 -  Chloride 101 - 111 mmol/L 103 -  CO2 22 - 32 mmol/L 28 -  Calcium 8.9 - 10.3 mg/dL 6.9(G8.7(L) -   \ CBC Latest Ref Rng & Units 04/07/2016  WBC 3.8 - 10.6 K/uL 8.0  Hemoglobin 13.0 - 18.0 g/dL 29.514.3  Hematocrit 28.440.0 - 52.0 %  42.9  Platelets 150 - 440 K/uL 195   No new CXR   IMPRESSION: 1) Severe COPD/emphysema 2) Recalcitrant smoker 3) BUL masses - likely synchronous malignancies  PLAN: 1) Continue Spiriva 2) Discussed smoking cessation - he has no interest in making the effort 3) Follow up in 3 months with DK and with CXR prior   Billy Fischeravid Keyasia Jolliff, MD PCCM service Mobile [phone] Pager [phone] 12/23/2016

## 2017-03-19 ENCOUNTER — Telehealth: Payer: Self-pay | Admitting: Pulmonary Disease

## 2017-03-19 NOTE — Telephone Encounter (Signed)
Spoke with pt and informed him he needs to contact PCP for refills for Xanax. Pt asked that I call his daughter Marylene Landngela which I did and informed her also. Nothing further needed.

## 2017-03-19 NOTE — Telephone Encounter (Signed)
Pt daughter calling stating pt needs his Xanax refilled  Stated last time we had saw patient he mentioned to him we may need to up the dose   Please advise.

## 2017-03-19 NOTE — Telephone Encounter (Signed)
That should be through his primary MD  Billy Fischeravid Jorah Hua, MD PCCM service Mobile [phone] Pager [phone] 03/19/2017 3:15 PM

## 2017-03-19 NOTE — Telephone Encounter (Signed)
Please advise if we can refill the Xanax. Daughter mentioned per pt that you had discussed about increasing the dose. Please advise.

## 2017-03-19 NOTE — Telephone Encounter (Signed)
Spoke with daughter .  She will call [facility] to ask for a rx but is concerned that they may not be agreeable since kasa wrote the rx   [facility] Dr[facility] Marcello FennelHande what is going on with patient and call patient to discuss why Dr. Belia HemanKasa cannot do refill.

## 2017-03-19 NOTE — Telephone Encounter (Signed)
Spoke with son and informed him pt needs to go through PCP to get the Xanax refilled. Son verbalized understanding and states he will inform pt. Nothing further needed.

## 2017-03-23 ENCOUNTER — Ambulatory Visit: Payer: Medicare Other | Admitting: Pulmonary Disease

## 2017-04-26 ENCOUNTER — Ambulatory Visit: Payer: Medicare Other | Admitting: Pulmonary Disease

## 2017-04-26 ENCOUNTER — Encounter: Payer: Self-pay | Admitting: Pulmonary Disease

## 2017-04-26 ENCOUNTER — Ambulatory Visit (INDEPENDENT_AMBULATORY_CARE_PROVIDER_SITE_OTHER): Payer: Medicare Other | Admitting: Pulmonary Disease

## 2017-04-26 VITALS — BP 138/80 | HR 94 | Ht 70.0 in | Wt 158.0 lb

## 2017-04-26 DIAGNOSIS — J42 Unspecified chronic bronchitis: Secondary | ICD-10-CM

## 2017-04-26 DIAGNOSIS — J449 Chronic obstructive pulmonary disease, unspecified: Secondary | ICD-10-CM | POA: Diagnosis not present

## 2017-04-26 DIAGNOSIS — R918 Other nonspecific abnormal finding of lung field: Secondary | ICD-10-CM | POA: Diagnosis not present

## 2017-04-26 DIAGNOSIS — F17200 Nicotine dependence, unspecified, uncomplicated: Secondary | ICD-10-CM

## 2017-04-26 MED ORDER — UMECLIDINIUM-VILANTEROL 62.5-25 MCG/INH IN AEPB: 1.0000 | INHALATION_SPRAY | Freq: Every day | RESPIRATORY_TRACT | 0 refills | Status: AC

## 2017-04-26 NOTE — Patient Instructions (Addendum)
Trial of Anoro inhaler - one inhalation daily If you feel that anything is improved with Anoro, let us know and we will place a prescription and try to provide you with as many samples as possible  Follow-up in 4 months with chest x-ray prior to that visit

## 2017-04-26 NOTE — Progress Notes (Signed)
Patient ID: Tommy Jordan, male   DOB: 03/30/1932, 83 y.o.   MRN: [id number]   Patient seen in the office today and instructed on use of Anoro.  Patient expressed understanding and demonstrated technique.  Meyer CoryMisty Stanisha Lorenz, LPN

## 2017-04-27 NOTE — Progress Notes (Signed)
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 No evidence of lymphadenopathy or other metastatic disease within the thorax. Moderate to severe emphysema PET 04/14/16: 1. Bilateral upper lobe hypermetabolic lung lesions, consistent with synchronous primary bronchogenic carcinomas. 2. Mild hypermetabolism corresponding to a right paratracheal node, favored to be benign/reactive (especially given retained fatty hilum). Similarly, right hilar and low-level hypermetabolism is without definite CT correlate and favored to be reactive. 3. No extrathoracic metastatic disease identified  INTERVAL HISTORY: No major events  SUBJ: This is a routine office follow up. He continues to smoke up to a pack of cigarettes per day. He continues to have moderate exertional dyspnea and chronic cough productive of clear to white mucus. He denies hemoptysis. He denies weight loss. He has no pleuritic or anginal chest pain. Overall there is no change in his status compared to previous evaluation.  He was previously on Spiriva which he felt was modestly beneficial. He is presently out and is only using albuterol inhaler as needed which she uses 2-3 times per day.  OBJ: Vitals:   04/26/17 1004 04/26/17 1010  BP:  138/80  Pulse:  94  SpO2:  96%  Weight: 158 lb (71.7 kg)   Height: 5\' 10"  (1.778 m)   RA  NAD, slightly raspy voice quality HEENT WNL BS diminished and slightly coarse throughout without wheezes Reg, no M NABS, soft No C/C/E No focal neurologic deficits  DATA: BMP Latest Ref Rng & Units 04/07/2016 03/30/2016  Glucose 65 - 99 mg/dL 80 -  BUN 6 - 20 mg/dL 20 -  Creatinine 7.820.61 - 1.24 mg/dL  9.56(O1.94(H) 1.30(Q1.70(H)  Sodium 135 - 145 mmol/L 141 -  Potassium 3.5 - 5.1 mmol/L 4.3 -  Chloride 101 - 111 mmol/L 103 -  CO2 22 - 32 mmol/L 28 -  Calcium 8.9 - 10.3 mg/dL 6.5(H8.7(L) -   \ CBC Latest Ref Rng & Units 04/07/2016  WBC 3.8 - 10.6 K/uL 8.0  Hemoglobin 13.0 - 18.0 g/dL 84.614.3  Hematocrit 96.240.0 - 52.0 % 42.9  Platelets 150 - 440 K/uL 195   No new CXR   IMPRESSION: 1) Severe COPD/emphysema/Chronic bronchitis 2) Recalcitrant smoker 3) BUL masses - likely bronchogenic carcinoma. He has (understandably) refused any further evaluation and understands the likely diagnosis.  PLAN: 1) trial of Anoro inhaler - one inhalation daily. If he feels that this has improved his symptoms, he will contact us and we will place a prescription. We will also try to keep him well [place] samples due to his limited financial resources 2) he remains uninterested in smoking cessation 3) Follow up in 4 months with chest x-ray prior to that visit   Billy Fischeravid Simonds, MD PCCM service Mobile [phone] Pager [phone] 04/27/2017

## 2017-08-16 ENCOUNTER — Ambulatory Visit
Admission: RE | Admit: 2017-08-16 | Discharge: 2017-08-16 | Disposition: A | Discharge status: Home / Self Care | Payer: Medicare Other | Source: Ambulatory Visit | Attending: Pulmonary Disease | Admitting: Pulmonary Disease

## 2017-08-16 ENCOUNTER — Ambulatory Visit (INDEPENDENT_AMBULATORY_CARE_PROVIDER_SITE_OTHER): Payer: Medicare Other | Admitting: Internal Medicine

## 2017-08-16 ENCOUNTER — Encounter: Payer: Self-pay | Admitting: Internal Medicine

## 2017-08-16 VITALS — BP 138/88 | HR 98 | Resp 16 | Ht 70.0 in | Wt 162.0 lb

## 2017-08-16 DIAGNOSIS — J90 Pleural effusion, not elsewhere classified: Secondary | ICD-10-CM | POA: Insufficient documentation

## 2017-08-16 DIAGNOSIS — J449 Chronic obstructive pulmonary disease, unspecified: Secondary | ICD-10-CM | POA: Diagnosis not present

## 2017-08-16 DIAGNOSIS — J9611 Chronic respiratory failure with hypoxia: Secondary | ICD-10-CM

## 2017-08-16 DIAGNOSIS — R918 Other nonspecific abnormal finding of lung field: Secondary | ICD-10-CM

## 2017-08-16 DIAGNOSIS — J9811 Atelectasis: Secondary | ICD-10-CM | POA: Diagnosis not present

## 2017-08-16 MED ORDER — FORMOTEROL FUMARATE 20 MCG/2ML IN NEBU: 20.0000 ug | INHALATION_SOLUTION | Freq: Two times a day (BID) | RESPIRATORY_TRACT | 11 refills | Status: DC

## 2017-08-16 MED ORDER — BUDESONIDE 0.5 MG/2ML IN SUSP: 0.5000 mg | Freq: Two times a day (BID) | RESPIRATORY_TRACT | 11 refills | Status: DC

## 2017-08-16 NOTE — Progress Notes (Signed)
[facility] Pulmonary Medicine Consultation      Date: 10/18[facility]/2018,   MRN# [id number] Tommy BumpsStanley M Levings 02/11/1932 Code Status:  Code Status History    This patient does not have a recorded code status. Please follow your organizational policy for patients in this situation.     Hosp day:@LENGTHOFSTAYDAYS @ Referring MD: @ATDPROV @     PCP:      AdmissionWeight: 162 lb (73.5 kg)                 CurrentWeight: 162 lb (73.5 kg) Tommy Jordan is a 83 y.o. old male seen in consultation for lung mass at the request of Dr. Fidela JuneauFinnegen.     CHIEF COMPLAINT:   Follow up abnormal CT chest   HISTORY OF PRESENT ILLNESS   83 yo white male with  abnormal CT chest. Patient primary care physician obtained routine CXR which led to CT chest There is a LUL mass and RUL nodule, also evidence of emphysema b/l lungs  Patient is long time and current smoker 2 ppd for 65 years Patient has chronic SOB and DOE for many years, he has no wheezing and no chest pain Patient has persistent chronic intermittent cough with sputum production for many years  Has no signs of infection at this time, no lower ext swelling   Follow up appointment -patient refused 6MWT and PFT's He has also stopped BREO cant afford it patient stopped taking Spiriva and BREO and Prednisone He states that they do not help  Patient has intermittent anxiety and problems sleeping as well  Patient has 2 lung masses-HE HAS DECIDED NOT TO PURSUE ANY FURTHER WORK UP AND DOES NOT WANT ANY KIND OF THERAPY  AMBULATORY PULSE OXIMETRY DROPPED to 77% on RA Patient seems to have significantly declined with his respiratory status  has poor respiratory effort  Patient has no signs of infection at this time   [name]  Current Medication:  Current Outpatient Prescriptions:  .  albuterol (PROVENTIL HFA;VENTOLIN HFA) 108 (90 Base) MCG/ACT inhaler, Inhale 2 puffs into the lungs every 4 (four) hours as needed for wheezing or shortness  of breath., Disp: 1 Inhaler, Rfl: 8 .  ALPRAZolam (XANAX) 0.5 MG tablet, Take 1 tablet (0.5 mg total) by mouth 3 (three) times daily as needed for sleep or anxiety., Disp: 60 tablet, Rfl: 2 .  amLODipine (NORVASC) 2.5 MG tablet, Take 2.5 mg by mouth daily., Disp: , Rfl:  .  gabapentin (NEURONTIN) 300 MG capsule, Take 300 mg by mouth 2 (two) times daily., Disp: , Rfl:  .  HYDROcodone-acetaminophen (NORCO/VICODIN) 5-325 MG tablet, Take 1 tablet by mouth 2 (two) times daily., Disp: , Rfl:  .  lisinopril (PRINIVIL,ZESTRIL) 40 MG tablet, Take 40 mg by mouth daily., Disp: , Rfl:  .  Tiotropium Bromide Monohydrate (SPIRIVA RESPIMAT) 2.5 MCG/ACT AERS, Inhale 2 puffs into the lungs daily., Disp: 1 Inhaler, Rfl: 5 .  umeclidinium-vilanterol (ANORO ELLIPTA) 62.5-25 MCG/INH AEPB, Inhale 1 puff into the lungs daily., Disp: 60 each, Rfl: 0    ALLERGIES   Allopurinol     REVIEW OF SYSTEMS   Review of Systems  Constitutional: Positive for malaise/fatigue. Negative for chills, diaphoresis, fever and weight loss.  HENT: Negative for congestion and hearing loss.   Respiratory: Positive for shortness of breath. Negative for cough, hemoptysis, sputum production and wheezing.   Cardiovascular: Negative for chest pain, palpitations and orthopnea.  Gastrointestinal: Negative for abdominal pain, heartburn, nausea and vomiting.  Skin: Negative for rash.  Neurological:  Negative for weakness and headaches.  All other systems reviewed and are negative.    VS: BP 138/88 (BP Location: Left Arm, Cuff Size: Normal)   Pulse 98   Resp 16   Ht 5\' 10"  (1.778 m)   Wt 162 lb (73.5 kg)   SpO2 93%   BMI 23.24 kg/m      PHYSICAL EXAM  Physical Exam  Constitutional: He is oriented to person, place, and time. No distress.  HENT:  Mouth/Throat: No oropharyngeal exudate.  Eyes: No scleral icterus.  Neck: Neck supple.  Cardiovascular: Normal rate, regular rhythm and normal heart sounds.   No murmur  heard. Pulmonary/Chest: Effort normal and breath sounds normal. No stridor. No respiratory distress. He has no wheezes. He has no rales.  barrel chest  Musculoskeletal: Normal range of motion. He exhibits no edema.  Neurological: He is alert and oriented to person, place, and time. No cranial nerve deficit.  Skin: Skin is warm. He is not diaphoretic.  Psychiatric: He has a normal mood and affect.        Images revi  ASSESSMENT/PLAN   83 yo white male seen today chronic hypoxic resp failure with evidence of COPD with severe emphysema with Chronic bronchitis Gold Stage C with  new findings of LUL mass and RUL nodule likely c/w primary  lung cancer.  Patient with end-stage lung disease with chronic respiratory failure with debilitating respiratory compromise in the setting of undiagnosed primary lung cancer with severe deconditioned state   The Patient HAS REFUSED TO UNDERGO FURTHER DIAGNOSTIC TESTING AT THIS TIME and DOES NOT WANT ANY CHEMO OR RADIATION THERAPY  HE WANTS TO LIVE OUT THE REST OF HIS LIFE PEACEFULLY   #1 chronic shortness of breath and dyspnea on exertion related to multiple factor including end-stage COPD and deconditioned state  #2 COPD moderate to severe Gold stage D based on his current respiratory insufficiency Patient will need nebulized therapy Will start Pulmicort nebs twice daily Will start Peformist nebs twice daily No signs of infection at this time  #3 deconditioned state Patient unable to ambulate without oxygen therapy  #4 chronic hypoxic respiratory failure Patient will need oxygen support 24/7  #5 lung mass highly consistent with lung cancer No further work up at this time as patient declined all type of diagnostic procedures   overall prognosis is very poor patient at high risk for cardiac arrhythmias and death Will need to address goals of care at next visit patient remains full code at this time  Follow up in 6 year.   Patient/Family  are satisfied with Plan of action and management. All questions answered  Lucie Leather, M.D.  [facility]  Medical Director [facility] Medical Director [facility] Cardio-Pulmonary Department

## 2017-08-16 NOTE — Patient Instructions (Signed)
Start pulmicort nebs twice daily Start peformist nebs twice daily Patient will need oxygen with exertion and at night

## 2017-08-22 ENCOUNTER — Other Ambulatory Visit: Payer: Self-pay | Admitting: Internal Medicine

## 2017-08-22 ENCOUNTER — Telehealth: Payer: Self-pay | Admitting: Internal Medicine

## 2017-08-22 MED ORDER — BUDESONIDE 0.5 MG/2ML IN SUSP: 0.5000 mg | Freq: Two times a day (BID) | RESPIRATORY_TRACT | 11 refills | Status: AC

## 2017-08-22 MED ORDER — FORMOTEROL FUMARATE 20 MCG/2ML IN NEBU: 20.0000 ug | INHALATION_SOLUTION | Freq: Two times a day (BID) | RESPIRATORY_TRACT | 11 refills | Status: AC

## 2017-08-22 NOTE — Telephone Encounter (Signed)
Neb medications were faxed along with nebulizer therefore, if nebulizer rx received then neb medications had to be received. [facility] again.

## 2017-08-22 NOTE — Telephone Encounter (Signed)
PCC called [facility] to confirm receipt. Patient's son now [facility]. [facility] Nothing further needed. Attempt made to contact son but he has no vmail set up on phone.

## 2017-08-22 NOTE — Telephone Encounter (Signed)
[facility] spoke with patient son re: neb solution   They have received machine but the pharmacy does not have perforomist solution  [facility] [address] [place] but patient son states they do not have rx

## 2017-08-23 ENCOUNTER — Telehealth: Payer: Self-pay | Admitting: Internal Medicine

## 2017-08-23 NOTE — Telephone Encounter (Signed)
Son states they want the [facility]bulizer meds to [facility]. Will have Rhonda to call [facility]. Nothing further needed.

## 2017-08-23 NOTE — Telephone Encounter (Signed)
Pt son calling stating that one of the medication we sent in is going to cost him about $209.51   He is not sure which one it was  But would like to know is this normal and if so will that price ever go down  Or if there is anything that can take place of that for that is not affordable    Please advise

## 2017-08-24 ENCOUNTER — Ambulatory Visit: Payer: Medicare Other | Admitting: Internal Medicine

## 2017-08-27 ENCOUNTER — Telehealth: Payer: Self-pay | Admitting: Internal Medicine

## 2017-08-27 NOTE — Telephone Encounter (Signed)
Please call, needs a note stating pt is on oxygen.

## 2017-08-27 NOTE — Telephone Encounter (Signed)
LMOVM asking [facility] if they need just an OV note or something else. I asked that they call back and leave a fax number where to send it to. Will await return call.

## 2017-08-28 NOTE — Telephone Encounter (Signed)
2nd message left for [facility]([place]) to return call and let office know what is needed.ss

## 2017-08-28 NOTE — Telephone Encounter (Signed)
Sp[facility] with [facility] at [facility] and she states that she needs copy of OV notes faxed to [phone]. Nothing further needed.

## 2017-09-29 DEATH — deceased

## 2017-10-05 ENCOUNTER — Telehealth: Payer: Self-pay | Admitting: Internal Medicine

## 2017-10-05 ENCOUNTER — Other Ambulatory Visit: Payer: Self-pay | Admitting: Internal Medicine

## 2017-10-05 DIAGNOSIS — J432 Centrilobular emphysema: Secondary | ICD-10-CM

## 2017-10-05 NOTE — Telephone Encounter (Signed)
Order placed 08/16/17 for eval for POC and get portable tanks with bag also. [facility] calling to inquire about an order.

## 2017-10-05 NOTE — Telephone Encounter (Signed)
Ashly from [facility] calling asking if we can please send orders for patient to get Potable oxygen machine   Please send this in

## 2017-10-05 NOTE — Telephone Encounter (Signed)
Called and spoke with Morrie SheldonAshley at [facility]. Stated that patient had originally declined POC and now is requesting POC, however it has been over 30 days since [place] and OV.  Pt will need to repeat the SMW and an OV again to qualify along with a new order placed for the POC.  Will contact patient and advise to see if patient is willing to come in for an OV visit and repeat SMW. Rhonda J Cobb

## 2017-10-05 NOTE — Telephone Encounter (Signed)
Called and spoke with patient and he stated that he didn't want to have to go through another OV and SMW and the expense of the POC.  I asked patient to think about it and discuss with his family and let me know if he changed his mind.   I contacted Morrie Sheldonshley back with [facility] and advised him of what patient had stated and asked if [facility] could provide something more portable for o2 during the day than he currently had.  The order that was sent on 08/16/17 was for 02 during the day at 3 lpm and to eval for POC.  Upon speaking with Morrie Sheldonshley, the patient was never delivered any portable o2.  The only 02 the patient has is the concentrator for night time use and use around the house. The patient is unable to go outside or leave the house.  Morrie Sheldonshley is going to speak with Greggory StallionGeorge Emergency planning/management officer(manager) and see if there is anything that they can do. Waiting to hear back from Cecil-BishopAshley with [facility]. Rhonda J Cobb

## 2017-10-05 NOTE — Telephone Encounter (Signed)
Morrie SheldonAshley from [facility] called and stated that after speaking with Greggory StallionGeorge that if we could send them a new order for 02 at 3 lpm continuous via [employer] and POC.  Dr. Nicholos Johnsamachandran stated that he would sign order. Order to be placed. Chanda Businganks will be delivered Saturday.  Once patient has portable tanks a follow up visit will be necessary.   Order to be placed today and community message sent to [place] and Marchelle Folksmanda with [facility] to advise of order. Rhonda J Cobb

## 2017-10-19 IMAGING — CT CT CHEST W/ CM
2 of 3 series · 17 of 46 positions shown, 19 images · IV contrast (iopamidol)
Comparison: None.

CLINICAL DATA: Chronic cough for 1 year. Left upper lobe mass on
recent chest radiograph.

EXAM:
CT CHEST WITH CONTRAST
TECHNIQUE: Multidetector CT imaging of the chest was performed during
intravenous contrast administration.
CONTRAST:  75mL 7XYMV8-DPP IOPAMIDOL (7XYMV8-DPP) INJECTION 61%

[Series 2: routine chest with · axial · 0.75mm/px · z∈[-678,-408]mm · 14 of 62 slices shown, 16 images]
[im 4/62  soft-tissue]
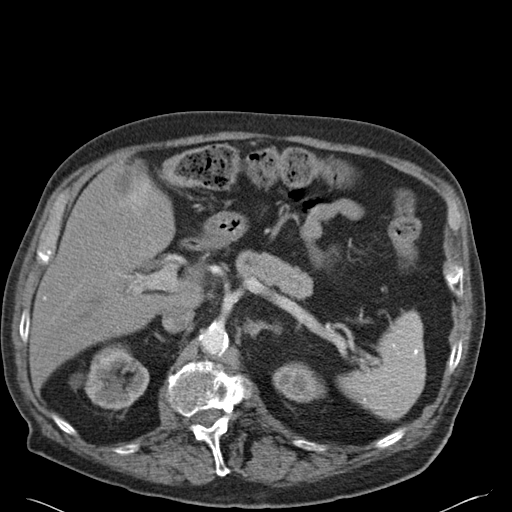
[im 4/62  bone]
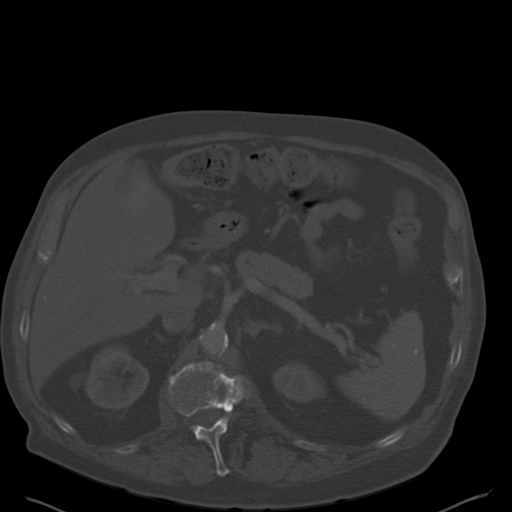
[im 8/62  soft-tissue]
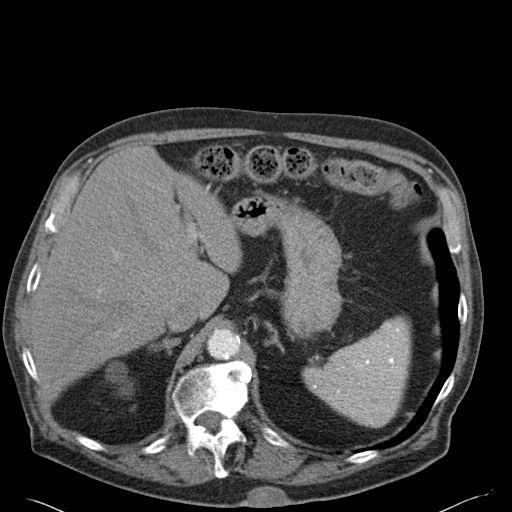
[im 12/62  soft-tissue]
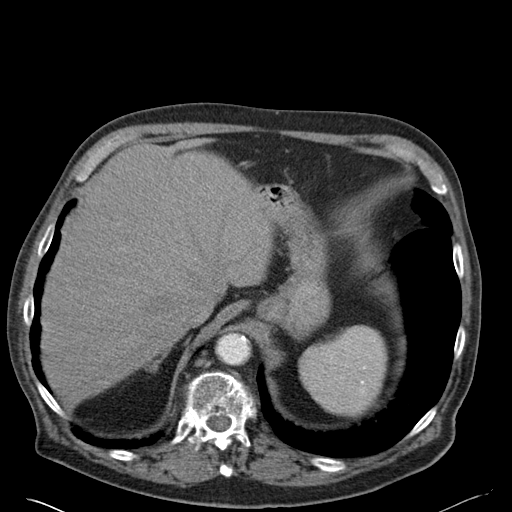
[im 16/62  soft-tissue]
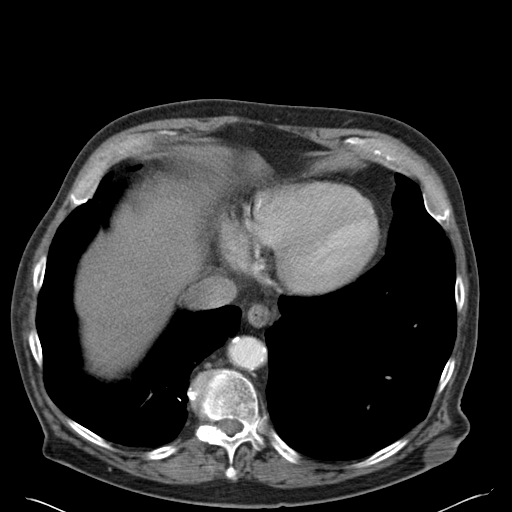
[im 20/62  soft-tissue]
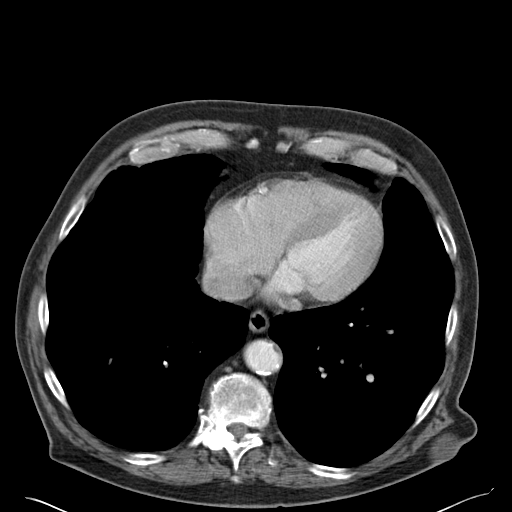
[im 24/62  soft-tissue]
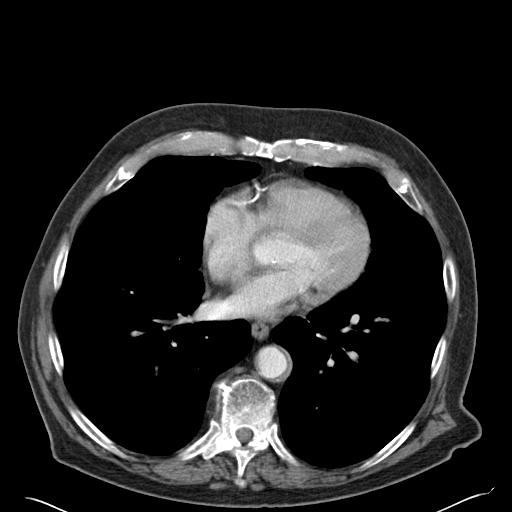
[im 28/62  soft-tissue]
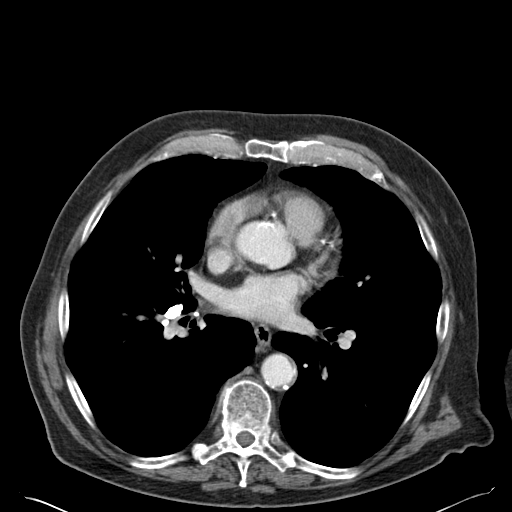
[im 34/62  soft-tissue]
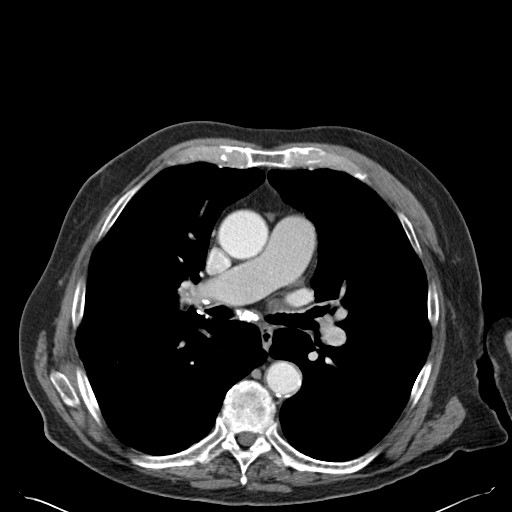
[im 38/62  soft-tissue]
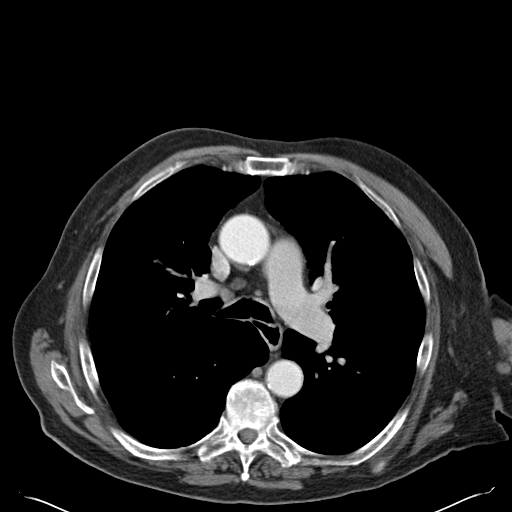
[im 38/62  bone]
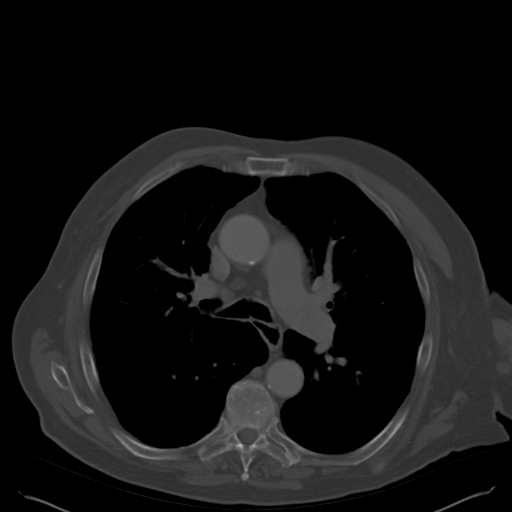
[im 42/62  soft-tissue]
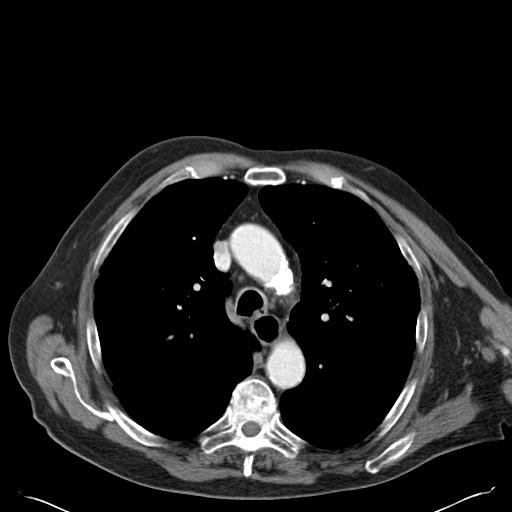
[im 46/62  soft-tissue]
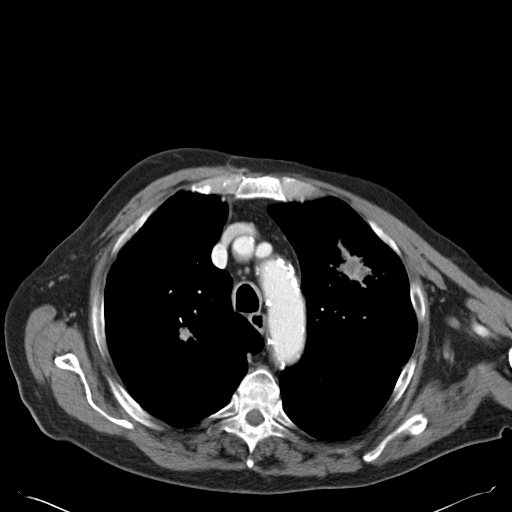
[im 50/62  soft-tissue]
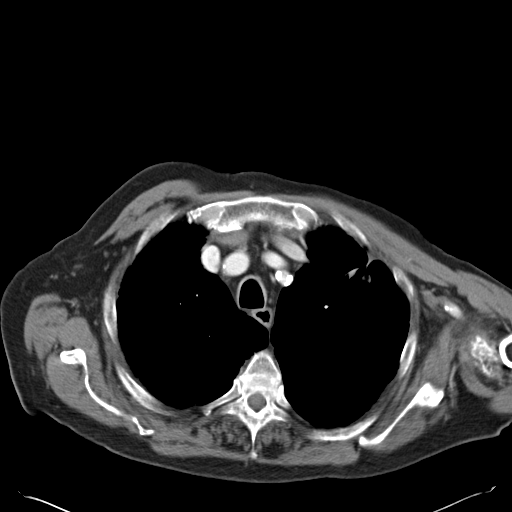
[im 54/62  soft-tissue]
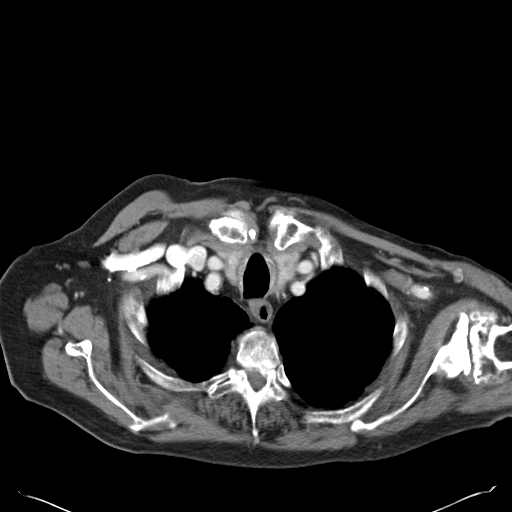
[im 58/62  soft-tissue]
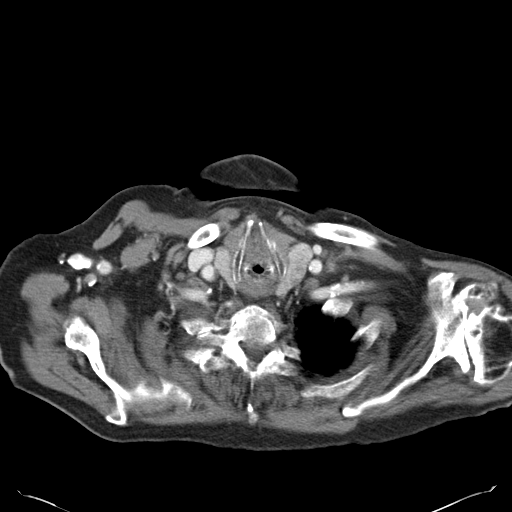

[Series 5: routine chest with cor · coronal · 0.71mm/px · 3 of 143 slices shown]
[im 48/143  soft-tissue]
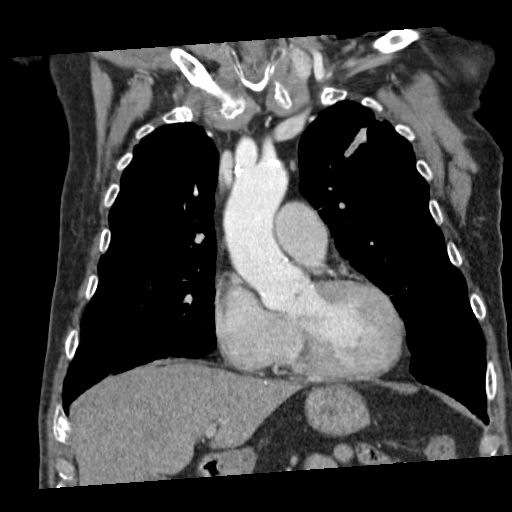
[im 64/143  soft-tissue]
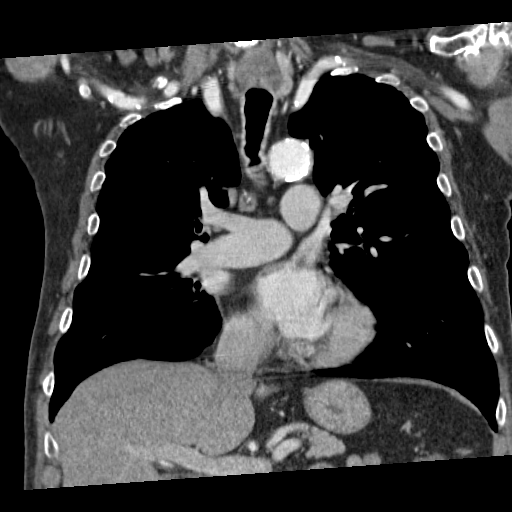
[im 79/143  soft-tissue]
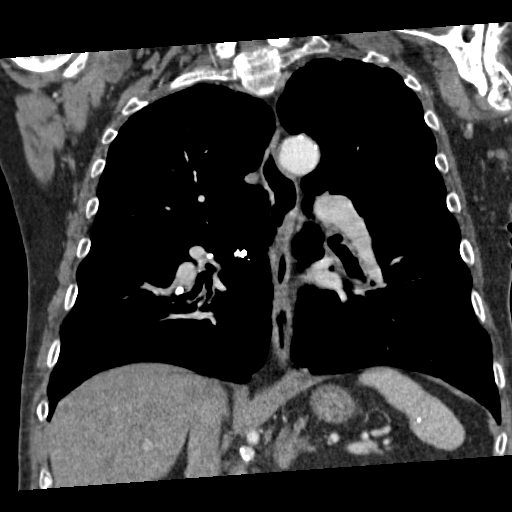

[17 of 46 positions shown; findings below may reference images not displayed]

FINDINGS: Mediastinum/Lymph Nodes: No lymphadenopathy identified within
mediastinum, hilar regions or elsewhere within the thorax. Calcified
right hilar and subcarinal mediastinal lymph nodes are seen,
consistent with old granulomatous disease.

A left thyroid lobe nodule is seen measuring 1.7 cm. Heart size is
normal. The three-vessel coronary artery calcification noted. Aortic
atherosclerotic calcification noted.

Lungs/Pleura: Moderate to severe emphysema is demonstrated.
Spiculated mass is seen in the anterior left upper lobe measuring
3.4 x 4.5 cm on image 39/series 3. A spiculated nodule is seen in
the central right upper lobe measuring 1.3 x 1.4 cm on image
40/series 3. Both of these are highly suspicious for primary
bronchogenic carcinomas. A large calcified granuloma is noted in the
medial right lower lobe. No evidence of pleural effusion.

Upper abdomen: No acute findings. Normal appearance both adrenal
glands and visualized portion of liver. Probable tiny right renal
cysts noted.

Musculoskeletal: No chest wall mass or suspicious bone lesions
identified.
IMPRESSION: 4.5 cm spiculated mass in left upper lobe and 1.4 cm spiculated
nodule in right upper lobe, highly suspicious for synchronous
primary bronchogenic carcinomas.

No evidence of lymphadenopathy or other metastatic disease within
the thorax.

Moderate to severe emphysema.

1.7 cm left thyroid lobe nodule. Thyroid ultrasound could be
performed for further evaluation although this may not be necessary
in setting of limited life expectancy and comorbidities. This
follows ACR consensus guidelines: Managing Incidental Thyroid
Nodules Detected on Imaging: White Paper of [REDACTED]. [HOSPITAL] 4468; [DATE].

## 2017-10-24 ENCOUNTER — Telehealth: Payer: Self-pay | Admitting: Internal Medicine

## 2017-10-24 NOTE — Telephone Encounter (Signed)
Returned call to patient's son and let him know it is ok. He reports his father's 02 levels while sleeping have improved hanging around 95% Where it was dropping in 60s---80s. Nothing furhter needed.

## 2017-10-24 NOTE — Telephone Encounter (Signed)
That is OK

## 2017-10-24 NOTE — Telephone Encounter (Signed)
Patient is on oxygen generator Setting is at 2.5 but due to oxygen being low and pt getting exhausted moving to the bathroom and such son Tommy Jordan has moved setting to around 2.75-2.8 and it has seemed to help Patient would like to make sure this is okay and discuss further Please call

## 2017-10-30 DEATH — deceased
